# Patient Record
Sex: Female | Born: 1955
Health system: Southern US, Community
[De-identification: ages and names within clinical notes are randomized; demographics above are authoritative.]

## PROBLEM LIST (undated history)

## (undated) DIAGNOSIS — R200 Anesthesia of skin: Secondary | ICD-10-CM

## (undated) DIAGNOSIS — E119 Type 2 diabetes mellitus without complications: Secondary | ICD-10-CM

## (undated) DIAGNOSIS — R7303 Prediabetes: Secondary | ICD-10-CM

## (undated) DIAGNOSIS — I10 Essential (primary) hypertension: Secondary | ICD-10-CM

## (undated) DIAGNOSIS — M419 Scoliosis, unspecified: Secondary | ICD-10-CM

## (undated) DIAGNOSIS — G5600 Carpal tunnel syndrome, unspecified upper limb: Secondary | ICD-10-CM

## (undated) HISTORY — PX: CHOLECYSTECTOMY: SHX55

## (undated) HISTORY — PX: BACK SURGERY: SHX140

## (undated) HISTORY — PX: OTHER SURGICAL HISTORY: SHX169

## (undated) HISTORY — DX: Prediabetes: R73.03

## (undated) HISTORY — DX: Carpal tunnel syndrome, unspecified upper limb: G56.00

## (undated) HISTORY — PX: ABDOMINAL ADHESION SURGERY: SHX90

---

## 2000-01-31 ENCOUNTER — Other Ambulatory Visit: Admission: RE | Admit: 2000-01-31 | Discharge: 2000-01-31 | Payer: Self-pay | Admitting: *Deleted

## 2002-09-04 ENCOUNTER — Encounter: Payer: Self-pay | Admitting: Emergency Medicine

## 2002-09-04 ENCOUNTER — Emergency Department (HOSPITAL_COMMUNITY): Admission: EM | Admit: 2002-09-04 | Discharge: 2002-09-04 | Payer: Self-pay | Admitting: Emergency Medicine

## 2002-09-07 ENCOUNTER — Ambulatory Visit (HOSPITAL_COMMUNITY): Admission: RE | Admit: 2002-09-07 | Discharge: 2002-09-07 | Payer: Self-pay | Admitting: Orthopaedic Surgery

## 2002-09-07 ENCOUNTER — Encounter: Payer: Self-pay | Admitting: Orthopaedic Surgery

## 2002-09-14 ENCOUNTER — Ambulatory Visit (HOSPITAL_COMMUNITY): Admission: RE | Admit: 2002-09-14 | Discharge: 2002-09-14 | Payer: Self-pay | Admitting: Orthopaedic Surgery

## 2003-12-27 ENCOUNTER — Ambulatory Visit (HOSPITAL_COMMUNITY): Admission: RE | Admit: 2003-12-27 | Discharge: 2003-12-27 | Payer: Self-pay | Admitting: Family Medicine

## 2004-02-27 ENCOUNTER — Ambulatory Visit (HOSPITAL_COMMUNITY): Admission: RE | Admit: 2004-02-27 | Discharge: 2004-02-27 | Payer: Self-pay | Admitting: General Surgery

## 2004-03-05 ENCOUNTER — Ambulatory Visit (HOSPITAL_COMMUNITY): Admission: RE | Admit: 2004-03-05 | Discharge: 2004-03-05 | Payer: Self-pay | Admitting: General Surgery

## 2004-05-04 ENCOUNTER — Observation Stay (HOSPITAL_COMMUNITY): Admission: RE | Admit: 2004-05-04 | Discharge: 2004-05-05 | Payer: Self-pay | Admitting: General Surgery

## 2008-05-19 ENCOUNTER — Ambulatory Visit (HOSPITAL_COMMUNITY): Admission: RE | Admit: 2008-05-19 | Discharge: 2008-05-19 | Payer: Self-pay | Admitting: Internal Medicine

## 2008-07-27 ENCOUNTER — Ambulatory Visit (HOSPITAL_COMMUNITY): Admission: RE | Admit: 2008-07-27 | Discharge: 2008-07-27 | Payer: Self-pay | Admitting: Internal Medicine

## 2009-10-13 ENCOUNTER — Ambulatory Visit (HOSPITAL_COMMUNITY): Admission: RE | Admit: 2009-10-13 | Discharge: 2009-10-13 | Payer: Self-pay | Admitting: Internal Medicine

## 2010-06-29 NOTE — H&P (Signed)
Kara Garcia, Kara Garcia                            ACCOUNT NO.:  [id number]   MEDICAL RECORD NO.:  [id number]                  PATIENT TYPE:   LOCATION:                                       FACILITY:   PHYSICIAN:  J. Darreld Mclean, M.D.              DATE OF BIRTH:   DATE OF ADMISSION:  DATE OF DISCHARGE:                                HISTORY & PHYSICAL   CHIEF COMPLAINT:  I hurt my left knee.   The patient fell and injured her left knee on July 24.  She slipped on a  waxed floor and injured her knee.  She was seen at [facility] ER  and x-rays were taken and she was evaluated there.  No acute injury was  noted on the x-ray.  Her injury occurred in her home. I saw her in the  office on the 26th.  She had a marked swelling of the knee and I aspirated  45 cc of fluid and gave her Depo-Medrol 40.  I was concerned about a  meniscal injury to the knee and recommended an MRI.  She had MRI done at the  hospital on the 28th.  It showed nonvisualization of the ACL compatible with  a tear, tear of the posterior horn of the medial meniscus, moderate joint  effusion and edema around the medial collateral ligament with a grade 1  sprain.  The patient was informed of these findings on her office visit on  the 30th.  Surgery was recommended.  I told her ACL may be torn, and it may  eventually need surgery at a later time.  She appeared to understand.  The  risks and imponderables of the procedure were discussed with her at that  visit; and, again, she appeared to understand.   CURRENT MEDICATIONS:  The patient has been on Vicodin 5/500 and Vioxx 25 for  this problem.  The patient normally takes no other medications.   PAST HISTORY:  Negative for heart disease, lung disease, kidney disease,  stroke, __________ weakness, hypertension, diabetes, TB, rheumatic fever,  cancer, polio, ulcer disease or circulatory problems.   ALLERGIES:  She denies any allergies.   She smokes 1/2 pack of  cigarettes a day.   She does not have a family physician.   She denies any previous surgery.   She denies any disease that runs in the family.   The patient lives here in [place] and works of [employer].   PHYSICAL EXAMINATION:  VITAL SIGNS:  BP 142/78, pulse 92, respirations 20,  afebrile.  Height 5 feet 2 inches, weight 139.  GENERAL:  She is alert, cooperative, and oriented.  HEENT:  Negative.  NECK:  Supple.  LUNGS:  Clear to P&A.  HEART:  Regular without murmur heard.  ABDOMEN:  Soft, without masses.  EXTREMITIES:  The left knee has mild effusion, pain and tenderness  particularly in the medial  and medial joint line.  Range of motion is 5  degrees and 90 degrees with pain.  She has a weakly positive drawer  significant.  Other extremities within normal limits.  CENTRAL NERVOUS SYSTEM: Intact.  SKIN: Intact.   IMPRESSION:  Tear medial meniscus left knee probably anterior cruciate  ligament tear left knee.   PLAN:  Operative arthroscopy. I have told her if she has a tear of the  meniscus we will remove that but we will not do an ACL repair at this  moment.  She may need later surgery for that.  She appears to understand.  Labs are pending.                                               Teola Bradley, M.D.    JWK/MEDQ  D:  09/13/2002  T:  09/13/2002  Job:  [id number]

## 2010-06-29 NOTE — H&P (Signed)
NAMEKENETHA, Kara Garcia                ACCOUNT NO.:  [id number]   MEDICAL RECORD NO.:  [id number]          PATIENT TYPE:  AMB   LOCATION:  DAY                           FACILITY:  [facility]   PHYSICIAN:  Jerolyn Shin C. Katrinka Blazing, M.D.   DATE OF BIRTH:  Jan 01, 1956   DATE OF ADMISSION:  DATE OF DISCHARGE:  LH                                HISTORY & PHYSICAL   HISTORY OF PRESENT ILLNESS:  A 55 year old female with history of recurrent  abdominal pain, nausea and vomiting.  The patient presented in early January  with a three-week history of crampy abdominal pain with nausea and vomiting  and episodic diarrhea.  She weighed 132 pounds at that time.  Exam was  unremarkable.  She had a gallbladder ultrasound which was negative for  gallstones.  She had a HIDA scan which was borderline positive with an  ejection fraction of 36%.  The patient had nausea and pain after the fatty  meal.  It was felt that she had chronic cholecystitis.  She was treated with  Pamine with some improvement.  She continued to be symptomatic and continued  to have epigastric pain radiating to her back with nausea.  The pain has  progressed now to where it is not associated with meals.  She has had weight  loss down to 123 pounds which is nine pounds over the past two months.  Her  last episode of vomiting was about two weeks ago.  The patient is  symptomatic on a daily basis has finally consented to have removal of her  gallbladder.   PAST HISTORY:  She has no other medical illnesses.   ALLERGIES:  She has no allergies.   PAST SURGICAL HISTORY:  1.  Pelvic laparoscopy x2.  2.  Left knee arthroscopy.   The patient does have some symptoms of depression which has responded to  Zoloft.  She has perimenopausal symptoms which has responded to low-dose  estrogen.   MEDICATIONS:  1.  Aciphex 20 mg daily.  2.  Pamine 5 mg t.i.d.  3.  Zoloft 50 mg daily.   SOCIAL HISTORY:  She is married.  She is an Adult nurse. She  smokes a pack of cigarettes a day, drinks an occasion alcoholic beverage.   PHYSICAL EXAMINATION:  GENERAL:  She is a very thin female in mild distress  due to abdominal pain and nausea.  VITAL SIGNS:  Blood pressure 150/88, pulse 72, respirations 20, weight 123  pounds, temperature 98.1.  HEENT:  Unremarkable.  NECK:  Supple.  No JVD, bruits, adenopathy, or thyromegaly.  CHEST:  Clear to auscultation.  HEART:  Regular rate and rhythm without murmur, gallop or rub.  ABDOMEN:  Moderate epigastric and right upper quadrant tenderness.  Normal  bowel sounds.  Prominent lower ribs bilaterally without tenderness.  EXTREMITIES:  No cyanosis, clubbing or edema.  NEUROLOGIC:  No focal motor, sensory or cerebellar deficits.   IMPRESSION:  1.  Chronic acalculous cholecystitis.  2.  Mild hypertension.  3.  Irritable bowel syndrome.  4.  Perimenopausal syndrome.   PLAN:  The  patient will have diagnostic laparoscopy with laparoscopic  cholecystectomy.      LCS/MEDQ  D:  05/04/2004  T:  05/04/2004  Job:  [id number]

## 2010-06-29 NOTE — Op Note (Signed)
NAMEBULA, CAVALIERI                          ACCOUNT NO.:  [id number]   MEDICAL RECORD NO.:  [id number]                   PATIENT TYPE:  AMB   LOCATION:  DAY                                  FACILITY:  [facility]   PHYSICIAN:  J. Darreld Mclean, M.D.              DATE OF BIRTH:  [birth year]   DATE OF PROCEDURE:  09/14/2002  DATE OF DISCHARGE:                                 OPERATIVE REPORT   PREOPERATIVE DIAGNOSIS:  Tear of medial meniscus, left knee, with anterior  cruciate ligament tear.   POSTOPERATIVE DIAGNOSIS:  Tear of medial meniscus, left knee, with anterior  cruciate ligament tear.   PROCEDURE:  Partial medial meniscectomy with arthroscopy of the left knee,  debridement of anterior cruciate ligament.   ANESTHESIA:  General.   TOURNIQUET TIME:  33 minutes.   SURGEON:  J. Darreld Mclean, M.D.   DRAINS:  None.   INDICATIONS:  The patient was playing tennis.  The left knee MRI shows tear  of the medial meniscus.  She has an old ACL tear.  She did not improve with  conservative treatment, and surgery was recommended.  Risks and  imponderables were discussed with the patient preoperatively.  She appears  to understand and agreed to the procedure as outlined.   FINDINGS:  The suprapatellar pouch was normal.  The medial meniscus had a  bucket-handle tear.  There was an absence of the ACL, it was just a slight  stump.  The lateral meniscus was normal.  The articular surfaces had some  early grade 2 changes medially and looked good laterally.  There were no  loose bodies.   Pertinent pictures taken throughout the procedure.   DESCRIPTION OF PROCEDURE:  The patient placed supine on the operating table  and the tourniquet and leg holder placed deflated on the left upper thigh.  She was prepped and draped in the usual manner after general anesthesia had  been given.  We re-ascertained we were doing Ms. Semidey.  Inflow cannula  inserted medially after the tourniquet was inflated.   I circumferentially  wrapped the leg with an Esmarch bandage prior to elevation of the tourniquet  to 300 mmHg.  Arthroscope inserted laterally and the knee systematically  examined.  Please see findings above.  She had a bucket-handle tear.  Using  a hook knife and straight knife, we were able to get some fragments.  We  were having difficulty with the shaver.  The first shaver did not work.  The  second shaver was brought in and we had some problems with the foot control.  I was able to get it working through the hand control but it took  approximately 5-10 minutes, which added some delay to the case.  No harm was  done to the patient at all.  The meniscal ends were then debrided using a  meniscal shaver.  A good smooth contour was  obtained.  The knee was  systematically re-examined and no new pathology found.  The stump of the ACL  was slightly debrided as well.  The knee was irrigated with the remaining  part of the lactated Ringer's.  The wounds were reapproximated using 3-0  nylon in an interrupted vertical mattress manner.  Marcaine 0.25% was  instilled in each portal after the sutures had been tied.  The tourniquet  was deflated at 33 minutes.  A sterile dressing applied, bulky dressing  applied, knee immobilizer applied.  The patient had a prescription for  Vicodin ES for pain.  We will see her in the office in approximately 10 days  to two weeks.  Physical therapy has been scheduled.  For any difficulty she  is to contact me at the office or through the hospital beeper system.  Numbers have been provided.                                               Teola Bradley, M.D.    JWK/MEDQ  D:  09/14/2002  T:  09/14/2002  Job:  [id number]

## 2010-06-29 NOTE — Op Note (Signed)
NAMEAISHWARYA, Kara Garcia                ACCOUNT NO.:  [id number]   MEDICAL RECORD NO.:  [id number]          PATIENT TYPE:  AMB   LOCATION:  DAY                           FACILITY:  [facility]   PHYSICIAN:  Jerolyn Shin C. Katrinka Blazing, M.D.   DATE OF BIRTH:  May 25, 1955   DATE OF PROCEDURE:  05/04/2004  DATE OF DISCHARGE:                                 OPERATIVE REPORT   PREOPERATIVE DIAGNOSIS:  Chronic acalculous cholecystitis   POSTOPERATIVE DIAGNOSIS:  Chronic acalculous cholecystitis   PROCEDURE:  Laparoscopic cholecystectomy.   SURGEON:  Dr. Katrinka Blazing.   DESCRIPTION:  Under general anesthesia, the patient's abdomen was prepped  and draped in a sterile field. A curvilinear infraumbilical incision was  made. Veress needle was inserted without difficulty. Abdomen was insufflated  with 2 liters of CO2. Using a Visiport guide, a 10-mm port was placed  uneventfully. Laparoscope was placed. Gallbladder was visualized. The  patient was placed in reverse Trendelenburg position. Under videoscopic  guidance, a 10-mm port and two 5-mm ports were placed without difficulty.  The gallbladder was grasped by the assistant. Adhesions to the gallbladder  were taken down with blunt dissection and electrocautery. There was a small  cystic duct which was followed back to the gallbladder. It was clipped with  five clips and divided. There were two small cystic artery branches. Each  was clipped with three clips and divided. Using electrocautery, the  gallbladder was then separated from the intrahepatic bed without difficulty.  It was placed in an EndoCatch device and retrieved intact. There was minimal  bleeding from the bed. Irrigation was carried out and the fluid was clear.  There was no bleeding and no bile leak. Irrigation above the liver was  carried out. There was a few adhesions above the liver between the liver and  the peritoneum which were not removed. The patient was then placed in  reverse Trendelenburg position.  Camera was moved from the infraumbilical  position into the right upper quadrant paramedian position. Inspection of  the lower abdomen revealed that there were some adhesions in the lower  midline. These were inferior to the site of the insertion of the  infraumbilical port. The adhesions were between omentum and abdominal wall  and appeared to be quite dense. The pelvis was inspected. The uterus did not  look remarkable. The ovaries appeared to be unremarkable. The sigmoid colon  looked normal. There was no thickening of the colon. There were no adhesions  of the colon to the surrounding structures. The visualized small bowel  appeared to be normal. It was elected not to take down the lower midline  adhesions since she was not having a problem in this area and since there  was no bowel involved. The patient was moved back to the neutral position.  CO2 was allowed to escape from the abdomen and the ports were removed. The  infraumbilical port was closed using 0 Vicryl on the  fascia and 4-0 Vicryl on the skin in a subcuticular pattern. The other  incisions were closed with staples. Dressings of OpSite were placed. The  patient  tolerated procedure well. She was awakened from anesthesia  uneventfully, transferred to a bed and taken to the postanesthetic care unit  for monitoring.      LCS/MEDQ  D:  05/04/2004  T:  05/04/2004  Job:  [id number]

## 2010-11-01 ENCOUNTER — Other Ambulatory Visit (HOSPITAL_COMMUNITY): Payer: Self-pay | Admitting: Internal Medicine

## 2010-11-01 DIAGNOSIS — Z139 Encounter for screening, unspecified: Secondary | ICD-10-CM

## 2010-11-23 ENCOUNTER — Ambulatory Visit (HOSPITAL_COMMUNITY): Payer: BC Managed Care – PPO

## 2010-12-07 ENCOUNTER — Ambulatory Visit (HOSPITAL_COMMUNITY)
Admission: RE | Admit: 2010-12-07 | Discharge: 2010-12-07 | Disposition: A | Discharge status: Home / Self Care | Payer: BC Managed Care – PPO | Source: Ambulatory Visit | Attending: Internal Medicine | Admitting: Internal Medicine

## 2010-12-07 DIAGNOSIS — Z1231 Encounter for screening mammogram for malignant neoplasm of breast: Secondary | ICD-10-CM | POA: Insufficient documentation

## 2010-12-07 DIAGNOSIS — Z139 Encounter for screening, unspecified: Secondary | ICD-10-CM

## 2011-02-18 ENCOUNTER — Emergency Department (HOSPITAL_COMMUNITY)
Admission: EM | Admit: 2011-02-18 | Discharge: 2011-02-18 | Disposition: A | Discharge status: Home / Self Care | Payer: BC Managed Care – PPO | Attending: Emergency Medicine | Admitting: Emergency Medicine

## 2011-02-18 ENCOUNTER — Emergency Department (HOSPITAL_COMMUNITY): Payer: BC Managed Care – PPO

## 2011-02-18 ENCOUNTER — Encounter: Payer: Self-pay | Admitting: Emergency Medicine

## 2011-02-18 DIAGNOSIS — M5412 Radiculopathy, cervical region: Secondary | ICD-10-CM | POA: Insufficient documentation

## 2011-02-18 DIAGNOSIS — I10 Essential (primary) hypertension: Secondary | ICD-10-CM | POA: Insufficient documentation

## 2011-02-18 DIAGNOSIS — F17200 Nicotine dependence, unspecified, uncomplicated: Secondary | ICD-10-CM | POA: Insufficient documentation

## 2011-02-18 DIAGNOSIS — M79609 Pain in unspecified limb: Secondary | ICD-10-CM | POA: Insufficient documentation

## 2011-02-18 DIAGNOSIS — R209 Unspecified disturbances of skin sensation: Secondary | ICD-10-CM | POA: Insufficient documentation

## 2011-02-18 HISTORY — DX: Essential (primary) hypertension: I10

## 2011-02-18 NOTE — ED Notes (Signed)
Discharge instructions reviewed with pt, questions answered. Pt verbalized understanding.  

## 2011-02-18 NOTE — ED Provider Notes (Signed)
History     CSN: [id number]  Arrival date & time 02/18/11  8469   First MD Initiated Contact with Patient 02/18/11 1011      Chief Complaint  Patient presents with  . Arm Pain  . Numbness    (Consider location/radiation/quality/duration/timing/severity/associated sxs/prior treatment) Patient is a 56 y.o. female presenting with arm pain. The history is provided by the patient and a relative.  Arm Pain This is a new problem. The current episode started in the past 7 days. The problem occurs constantly. The problem has been gradually worsening (She describes a 4 day history of left sided neck ,  shoulder and posterior upper back pain and placed on hydrcodone and robaxin by her doctor 3 days ago.  The medicines make her drowsy,  but have not improved her discomfort. Numbness today under left arm.). Associated symptoms include neck pain and numbness. Pertinent negatives include no abdominal pain, arthralgias, chest pain, congestion, fever, headaches, joint swelling, myalgias, nausea, rash, sore throat or weakness. Exacerbated by: Movement makes worse. She has tried oral narcotics (Robaxin) for the symptoms. The treatment provided no relief.    Past Medical History  Diagnosis Date  . Hypertension     Past Surgical History  Procedure Date  . Cholecystectomy   . Knee arthroplasty   . Abdominal adhesion surgery     History reviewed. No pertinent family history.  History  Substance Use Topics  . Smoking status: Current Everyday Smoker -- 1.0 packs/day  . Smokeless tobacco: Not on file  . Alcohol Use: No    OB History    Grav Para Term Preterm Abortions TAB SAB Ect Mult Living                  Review of Systems  Constitutional: Negative for fever.  HENT: Positive for neck pain. Negative for congestion, sore throat and neck stiffness.   Eyes: Negative.   Respiratory: Negative for chest tightness and shortness of breath.   Cardiovascular: Negative for chest pain.    Gastrointestinal: Negative for nausea and abdominal pain.  Genitourinary: Negative.   Musculoskeletal: Negative for myalgias, joint swelling and arthralgias.  Skin: Negative.  Negative for rash and wound.  Neurological: Positive for numbness. Negative for dizziness, weakness, light-headedness and headaches.  Hematological: Negative.   Psychiatric/Behavioral: Negative.     Allergies  Review of patient's allergies indicates no known allergies.  Home Medications   Current Outpatient Rx  Name Route Sig Dispense Refill  . HYDROCODONE-ACETAMINOPHEN 5-500 MG PO TABS Oral Take 1 tablet by mouth every 6 (six) hours as needed. For pain     . IBUPROFEN 200 MG PO TABS Oral Take 400 mg by mouth every 6 (six) hours as needed. For pain     . LOSARTAN POTASSIUM 100 MG PO TABS Oral Take 100 mg by mouth daily.      Marland Kitchen METHOCARBAMOL 750 MG PO TABS Oral Take 750 mg by mouth every 8 (eight) hours as needed. For pain     . ANALGESIC BALM 15-15 % EX OINT Topical Apply 1 application topically as needed. For muscle pain      . VITAMIN D (ERGOCALCIFEROL) 50000 UNITS PO CAPS Oral Take 50,000 Units by mouth every 7 (seven) days. On wednesdays       BP 151/76  Temp(Src) 98 F (36.7 C) (Oral)  Resp 18  Ht 5\' 1"  (1.549 m)  Wt 146 lb 5 oz (66.367 kg)  BMI 27.65 kg/m2  SpO2 99%  Physical Exam  Nursing note and vitals reviewed. Constitutional: She is oriented to person, place, and time. She appears well-developed and well-nourished.  HENT:  Head: Normocephalic and atraumatic.  Eyes: Conjunctivae are normal.  Neck: Normal range of motion.  Cardiovascular: Normal rate, regular rhythm, normal heart sounds and intact distal pulses.   Pulmonary/Chest: Effort normal and breath sounds normal. She has no wheezes.  Abdominal: Soft. Bowel sounds are normal. There is no tenderness.  Musculoskeletal: She exhibits tenderness. She exhibits no edema.       Right shoulder: She exhibits normal strength.       Cervical  back: She exhibits tenderness and pain. She exhibits no swelling, no edema and no spasm.       Arms: Neurological: She is alert and oriented to person, place, and time. She has normal strength. A sensory deficit is present.  Reflex Scores:      Tricep reflexes are 2+ on the right side and 2+ on the left side.      Bicep reflexes are 2+ on the right side and 2+ on the left side.      Equal grip strength.  Decreased sensation to fine touch left upper and lower forearm in ulnar distribution.  No rash,  No decreased strength in hand,  Fingers or wrist.  Skin: Skin is warm and dry.  Psychiatric: She has a normal mood and affect.    ED Course  Procedures (including critical care time)  Labs Reviewed - No data to display Dg Cervical Spine Complete  02/18/2011  *RADIOLOGY REPORT*  Clinical Data: Numbness down the left arm for 3 days.  CERVICAL SPINE - COMPLETE 4+ VIEW  Comparison: None.  Findings: Vertebral body height is maintained.  There is reversal of the normal cervical lordosis from C3-C6.  Bulky anterior endplate spurring is noted at C5-6.  Posterior endplate spurring is most prominent at C4-5 and C5-6.  There is multilevel advanced facet degenerative disease.  No fracture is identified.  Foraminal narrowing is present at multiple levels, most notable on the left and C3-4 and C6-7 and on the right at C3-4 and C5-6.  Lung apices are clear.  IMPRESSION: Marked multilevel degenerative disease.  No acute finding.  Original Report Authenticated By: Bernadene Bell. D'ALESSIO, M.D.     1. Cervical radiculopathy       MDM  Patient currently on vicodin and robaxin, ibuprofen.  No meds added to this regimen.  Encouraged heat applied 20 minutes several times daily.  F/u with pcp for possible mri if not improving over the next week.  Pt xray results discussed with her.          Candis Musa, PA 02/18/11 2134

## 2011-02-18 NOTE — ED Notes (Signed)
Pt seen by Dr.Hawking Friday for pain. Pt states pain was present but the numbness she has today was not there. Also complaining of pain under left arm. Denies CHEST PAIN OR SOB>

## 2011-02-20 NOTE — ED Provider Notes (Signed)
Medical screening examination/treatment/procedure(s) were performed by non-physician practitioner and as supervising physician I was immediately available for consultation/collaboration.   Teletha Petrea W Flornce Record, MD 02/20/11 1521 

## 2011-02-25 ENCOUNTER — Other Ambulatory Visit (HOSPITAL_COMMUNITY): Payer: Self-pay | Admitting: Internal Medicine

## 2011-02-25 DIAGNOSIS — M549 Dorsalgia, unspecified: Secondary | ICD-10-CM

## 2011-02-27 ENCOUNTER — Ambulatory Visit (HOSPITAL_COMMUNITY)
Admission: RE | Admit: 2011-02-27 | Discharge: 2011-02-27 | Disposition: A | Discharge status: Home / Self Care | Payer: BC Managed Care – PPO | Source: Ambulatory Visit | Attending: Internal Medicine | Admitting: Internal Medicine

## 2011-02-27 DIAGNOSIS — M549 Dorsalgia, unspecified: Secondary | ICD-10-CM

## 2011-02-27 DIAGNOSIS — M542 Cervicalgia: Secondary | ICD-10-CM | POA: Insufficient documentation

## 2011-02-27 DIAGNOSIS — M47812 Spondylosis without myelopathy or radiculopathy, cervical region: Secondary | ICD-10-CM | POA: Insufficient documentation

## 2011-02-27 DIAGNOSIS — R209 Unspecified disturbances of skin sensation: Secondary | ICD-10-CM | POA: Insufficient documentation

## 2011-02-27 DIAGNOSIS — M5020 Other cervical disc displacement, unspecified cervical region: Secondary | ICD-10-CM | POA: Insufficient documentation

## 2011-02-27 DIAGNOSIS — M25519 Pain in unspecified shoulder: Secondary | ICD-10-CM | POA: Insufficient documentation

## 2011-02-27 DIAGNOSIS — M5380 Other specified dorsopathies, site unspecified: Secondary | ICD-10-CM | POA: Insufficient documentation

## 2012-11-05 NOTE — H&P (Signed)
  NTS SOAP Note  Vital Signs:  Vitals as of: 11/05/2012: Systolic 156: Diastolic 77: Heart Rate 67: Temp 97.28F: Height 88ft 1in: Weight 127Lbs 0 Ounces: BMI 24  BMI : 24 kg/m2  Subjective: This 58 Years 28 Months old Female presents for screening colonoscopy.  Denies any gi complaints.  No family h/o colon cancer.  Never has had a colonoscopy.   Review of Symptoms:  Constitutional:unremarkable   Head:unremarkable    Eyes:unremarkable   Nose/Mouth/Throat:unremarkable Cardiovascular:  unremarkable   Respiratory:unremarkable   Gastrointestinal:  unremarkable   Genitourinary:unremarkable     Musculoskeletal:unremarkable   Skin:unremarkable Hematolgic/Lymphatic:unremarkable     Allergic/Immunologic:unremarkable     Past Medical History:    Reviewed   Past Medical History  Surgical History: none Medical Problems: HTN Allergies: nkda Medications: losartin   Social History:Reviewed  Social History  Preferred Language: English Race:  White Ethnicity: Not Hispanic / Latino Age: 75 Years 4 Months Marital Status:  M Alcohol:  No Recreational drug(s):  No   Smoking Status: Light tobacoo smoker reviewed on 11/05/2012 Started Date: 02/11/1978 Packs per day: 0.50 Functional Status reviewed on mm/dd/yyyy ------------------------------------------------ Bathing: Normal Cooking: Normal Dressing: Normal Driving: Normal Eating: Normal Managing Meds: Normal Oral Care: Normal Shopping: Normal Toileting: Normal Transferring: Normal Walking: Normal Cognitive Status reviewed on mm/dd/yyyy ------------------------------------------------ Attention: Normal Decision Making: Normal Language: Normal Memory: Normal Motor: Normal Perception: Normal Problem Solving: Normal Visual and Spatial: Normal   Family History:  Reviewed  Family Health History Mother, Deceased; Heart attack (myocardial infarction);  Father, Deceased;  Cancer unspecified;     Objective Information: General:  Well appearing, well nourished in no distress. Heart:  RRR, no murmur Lungs:    CTA bilaterally, no wheezes, rhonchi, rales.  Breathing unlabored. Abdomen:Soft, NT/ND, no HSM, no masses.   deferred to procedure  Assessment:Need for screening TCS  Diagnosis &amp; Procedure Smart Code   Plan:Scheduled for screening TCS on 11/17/12.   Patient Education:Alternative treatments to surgery were discussed with patient (and family).  Risks and benefits  of procedure including bleeding and perforation were fully explained to the patient (and family) who gave informed consent. Patient/family questions were addressed.  Follow-up:Pending Surgery

## 2012-11-17 ENCOUNTER — Encounter (HOSPITAL_COMMUNITY): Payer: Self-pay | Admitting: *Deleted

## 2012-11-17 ENCOUNTER — Encounter (HOSPITAL_COMMUNITY)
Admission: RE | Disposition: A | Discharge status: Home / Self Care | Payer: Self-pay | Source: Ambulatory Visit | Attending: General Surgery

## 2012-11-17 ENCOUNTER — Ambulatory Visit (HOSPITAL_COMMUNITY)
Admission: RE | Admit: 2012-11-17 | Discharge: 2012-11-17 | Disposition: A | Discharge status: Home / Self Care | Payer: BC Managed Care – PPO | Source: Ambulatory Visit | Attending: General Surgery | Admitting: General Surgery

## 2012-11-17 DIAGNOSIS — I10 Essential (primary) hypertension: Secondary | ICD-10-CM | POA: Insufficient documentation

## 2012-11-17 DIAGNOSIS — Z1211 Encounter for screening for malignant neoplasm of colon: Secondary | ICD-10-CM | POA: Insufficient documentation

## 2012-11-17 HISTORY — DX: Anesthesia of skin: R20.0

## 2012-11-17 HISTORY — PX: COLONOSCOPY: SHX5424

## 2012-11-17 SURGERY — COLONOSCOPY
Anesthesia: Moderate Sedation

## 2012-11-17 MED ORDER — SODIUM CHLORIDE 0.9 % IV SOLN
INTRAVENOUS | Status: DC
  Administered 2012-11-17: 08:00:00 via INTRAVENOUS

## 2012-11-17 MED ORDER — MIDAZOLAM HCL 5 MG/5ML IJ SOLN
INTRAMUSCULAR | Status: AC
  Filled 2012-11-17: qty 5

## 2012-11-17 MED ORDER — STERILE WATER FOR IRRIGATION IR SOLN
Status: DC | PRN
  Administered 2012-11-17: 09:00:00

## 2012-11-17 MED ORDER — MEPERIDINE HCL 50 MG/ML IJ SOLN
INTRAMUSCULAR | Status: DC | PRN
  Administered 2012-11-17: 50 mg via INTRAVENOUS

## 2012-11-17 MED ORDER — MEPERIDINE HCL 50 MG/ML IJ SOLN
INTRAMUSCULAR | Status: AC
  Filled 2012-11-17: qty 1

## 2012-11-17 MED ORDER — MIDAZOLAM HCL 5 MG/5ML IJ SOLN
INTRAMUSCULAR | Status: DC | PRN
  Administered 2012-11-17: 3 mg via INTRAVENOUS
  Administered 2012-11-17: 1 mg via INTRAVENOUS

## 2012-11-17 NOTE — Op Note (Addendum)
[facility] [address] [place], [address]   COLONOSCOPY PROCEDURE REPORT  PATIENT: Kara Garcia, Kara Garcia  MR#: [id number] BIRTHDATE: Feb 17, 1955 , 57  yrs. old GENDER: Female ENDOSCOPIST: Franky Macho, MD REFERRED XB:JYNW, Zach PROCEDURE DATE:  11/17/2012 PROCEDURE:   Colonoscopy, screening ASA CLASS:   Class II INDICATIONS:Average risk patient for colon cancer. MEDICATIONS: Versed 4 mg IV and Demerol 50 mg IV  DESCRIPTION OF PROCEDURE:   After the risks benefits and alternatives of the procedure were thoroughly explained, informed consent was obtained.  A digital rectal exam revealed no abnormalities of the rectum.   The EC-[id number] ([id number])  endoscope was introduced through the anus and advanced to the cecum, which was identified by both the appendix and ileocecal valve. No adverse events experienced.   The quality of the prep was adequate, using MoviPrep  The instrument was then slowly withdrawn as the colon was fully examined.      COLON FINDINGS: A normal appearing cecum, ileocecal valve, and appendiceal orifice were identified.  The ascending, hepatic flexure, transverse, splenic flexure, descending, sigmoid colon and rectum appeared unremarkable.  No polyps or cancers were seen. Retroflexed views revealed no abnormalities. The time to cecum=7 minutes 0 seconds.  Withdrawal time=2 minutes 0 seconds.  The scope was withdrawn and the procedure completed. COMPLICATIONS: There were no complications.  ENDOSCOPIC IMPRESSION: Normal colon  RECOMMENDATIONS: Repeat Colonscopy in 10 years.   eSigned:  Franky Macho, MD 11/17/2012 9:35 AM   cc:

## 2012-11-17 NOTE — Interval H&P Note (Signed)
History and Physical Interval Note:  11/17/2012 9:19 AM  Kara Garcia  has presented today for surgery, with the diagnosis of screening  The various methods of treatment have been discussed with the patient and family. After consideration of risks, benefits and other options for treatment, the patient has consented to  Procedure(s): COLONOSCOPY (N/A) as a surgical intervention .  The patient's history has been reviewed, patient examined, no change in status, stable for surgery.  I have reviewed the patient's chart and labs.  Questions were answered to the patient's satisfaction.     [name]

## 2012-11-19 ENCOUNTER — Encounter (HOSPITAL_COMMUNITY): Payer: Self-pay | Admitting: General Surgery

## 2014-12-16 ENCOUNTER — Other Ambulatory Visit (HOSPITAL_COMMUNITY): Payer: Self-pay | Admitting: Internal Medicine

## 2014-12-16 ENCOUNTER — Ambulatory Visit (HOSPITAL_COMMUNITY)
Admission: RE | Admit: 2014-12-16 | Discharge: 2014-12-16 | Disposition: A | Discharge status: Home / Self Care | Payer: BLUE CROSS/BLUE SHIELD | Source: Ambulatory Visit | Attending: Internal Medicine | Admitting: Internal Medicine

## 2014-12-16 DIAGNOSIS — M25512 Pain in left shoulder: Secondary | ICD-10-CM | POA: Diagnosis present

## 2014-12-16 DIAGNOSIS — M4040 Postural lordosis, site unspecified: Secondary | ICD-10-CM | POA: Insufficient documentation

## 2014-12-16 DIAGNOSIS — M542 Cervicalgia: Secondary | ICD-10-CM | POA: Insufficient documentation

## 2014-12-16 DIAGNOSIS — M47892 Other spondylosis, cervical region: Secondary | ICD-10-CM | POA: Insufficient documentation

## 2015-01-11 ENCOUNTER — Other Ambulatory Visit: Payer: Self-pay | Admitting: Orthopedic Surgery

## 2015-01-11 DIAGNOSIS — M4722 Other spondylosis with radiculopathy, cervical region: Secondary | ICD-10-CM

## 2015-01-24 ENCOUNTER — Ambulatory Visit
Admission: RE | Admit: 2015-01-24 | Discharge: 2015-01-24 | Disposition: A | Discharge status: Home / Self Care | Payer: BLUE CROSS/BLUE SHIELD | Source: Ambulatory Visit | Attending: Orthopedic Surgery | Admitting: Orthopedic Surgery

## 2015-01-24 DIAGNOSIS — M4722 Other spondylosis with radiculopathy, cervical region: Secondary | ICD-10-CM

## 2015-01-24 MED ORDER — ONDANSETRON HCL 4 MG/2ML IJ SOLN
4.0000 mg | Freq: Once | INTRAMUSCULAR | Status: AC
  Administered 2015-01-24: 4 mg via INTRAMUSCULAR

## 2015-01-24 MED ORDER — DIAZEPAM 5 MG PO TABS
10.0000 mg | ORAL_TABLET | Freq: Once | ORAL | Status: AC
  Administered 2015-01-24: 10 mg via ORAL

## 2015-01-24 MED ORDER — SODIUM CHLORIDE 0.9 % IV SOLN
Freq: Once | INTRAVENOUS | Status: AC
  Administered 2015-01-24: 11:00:00 via INTRAVENOUS

## 2015-01-24 MED ORDER — IOHEXOL 300 MG/ML  SOLN
10.0000 mL | Freq: Once | INTRAMUSCULAR | Status: AC | PRN
  Administered 2015-01-24: 10 mL via INTRATHECAL

## 2015-01-24 MED ORDER — CEFAZOLIN SODIUM-DEXTROSE 2-3 GM-% IV SOLR
2.0000 g | Freq: Once | INTRAVENOUS | Status: AC
  Administered 2015-01-24: 2 g via INTRAVENOUS

## 2015-01-24 MED ORDER — MEPERIDINE HCL 100 MG/ML IJ SOLN
75.0000 mg | Freq: Once | INTRAMUSCULAR | Status: AC
  Administered 2015-01-24: 75 mg via INTRAMUSCULAR

## 2015-01-24 NOTE — Progress Notes (Signed)
Patient states she has been off Tramadol for at least the past two days. 

## 2015-01-24 NOTE — Discharge Instructions (Signed)
Myelogram Discharge Instructions  1. Go home and rest quietly for the next 24 hours.  It is important to lie flat for the next 24 hours.  Get up only to go to the restroom.  You may lie in the bed or on a couch on your back, your stomach, your left side or your right side.  You may have one pillow under your head.  You may have pillows between your knees while you are on your side or under your knees while you are on your back.  2. DO NOT drive today.  Recline the seat as far back as it will go, while still wearing your seat belt, on the way home.  3. You may get up to go to the bathroom as needed.  You may sit up for 10 minutes to eat.  You may resume your normal diet and medications unless otherwise indicated.  Drink lots of extra fluids today and tomorrow.  4. The incidence of headache, nausea, or vomiting is about 5% (one in 20 patients).  If you develop a headache, lie flat and drink plenty of fluids until the headache goes away.  Caffeinated beverages may be helpful.  If you develop severe nausea and vomiting or a headache that does not go away with flat bed rest, call [phone].  5. You may resume normal activities after your 24 hours of bed rest is over; however, do not exert yourself strongly or do any heavy lifting tomorrow. If when you get up you have a headache when standing, go back to bed and force fluids for another 24 hours.  6. Call your physician for a follow-up appointment.  The results of your myelogram will be sent directly to your physician by the following day.  7. If you have any questions or if complications develop after you arrive home, please call [phone].  Discharge instructions have been explained to the patient.  The patient, or the person responsible for the patient, fully understands these instructions.      May resume Tramadol on Dec. 14, 2016, after 9:30 am.

## 2015-03-21 ENCOUNTER — Encounter: Payer: BLUE CROSS/BLUE SHIELD | Admitting: Neurology

## 2015-03-22 ENCOUNTER — Encounter: Payer: Self-pay | Admitting: Neurology

## 2015-03-22 ENCOUNTER — Ambulatory Visit (INDEPENDENT_AMBULATORY_CARE_PROVIDER_SITE_OTHER): Payer: BLUE CROSS/BLUE SHIELD | Admitting: Neurology

## 2015-03-22 ENCOUNTER — Ambulatory Visit (INDEPENDENT_AMBULATORY_CARE_PROVIDER_SITE_OTHER): Payer: Self-pay | Admitting: Neurology

## 2015-03-22 DIAGNOSIS — G5601 Carpal tunnel syndrome, right upper limb: Secondary | ICD-10-CM

## 2015-03-22 DIAGNOSIS — G5603 Carpal tunnel syndrome, bilateral upper limbs: Secondary | ICD-10-CM

## 2015-03-22 DIAGNOSIS — G5602 Carpal tunnel syndrome, left upper limb: Secondary | ICD-10-CM

## 2015-03-22 DIAGNOSIS — G5600 Carpal tunnel syndrome, unspecified upper limb: Secondary | ICD-10-CM

## 2015-03-22 HISTORY — DX: Carpal tunnel syndrome, unspecified upper limb: G56.00

## 2015-03-22 NOTE — Procedures (Signed)
     HISTORY:  Kara Garcia is a 60 year old patient with a history of prior cervical spine surgery. She has had difficulty with numbness of both hands since 2013, with some tingling sensations down the left arm that have been present since the cervical spine surgery. The patient is being evaluated for a possible neuropathy or a cervical radiculopathy.  NERVE CONDUCTION STUDIES:  Nerve conduction studies were performed on both upper extremities. The distal motor latencies for the median nerves were prolonged bilaterally, with normal motor amplitudes for these nerves bilaterally. The distal motor latencies and motor amplitudes for the ulnar nerves were normal bilaterally. The F wave latencies for the median nerves were borderline normal on the right, normal on the left, and normal for the ulnar nerves bilaterally. The nerve conduction velocities for the median and ulnar nerves were normal bilaterally. The sensory latencies for the median nerves were prolonged bilaterally, normal for the ulnar nerves bilaterally.  EMG STUDIES:  EMG study was performed on the left upper extremity:  The first dorsal interosseous muscle reveals 2 to 4 K units with full recruitment. No fibrillations or positive waves were noted. The abductor pollicis brevis muscle reveals 2 to 4 K units with full recruitment. No fibrillations or positive waves were noted. The extensor indicis proprius muscle reveals 1 to 3 K units with full recruitment. No fibrillations or positive waves were noted. The pronator teres muscle reveals 2 to 3 K units with full recruitment. No fibrillations or positive waves were noted. The biceps muscle reveals 1 to 2 K units with full recruitment. No fibrillations or positive waves were noted. The triceps muscle reveals 2 to 4 K units with full recruitment. No fibrillations or positive waves were noted. The anterior deltoid muscle reveals 2 to 3 K units with full recruitment. No fibrillations or  positive waves were noted. The cervical paraspinal muscles were tested at 2 levels. No abnormalities of insertional activity were seen at either level tested. There was good relaxation.   IMPRESSION:  Nerve conduction studies done on both upper extremities shows evidence of bilateral tunnel syndrome of mild severity, somewhat more prominent on the right than the left. EMG evaluation of the left upper extremity was unremarkable, without evidence of an overlying cervical radiculopathy.  Marlan Palau MD 03/22/2015 1:28 PM  [facility] [address] [place], [place] [address]  Phone [phone] Fax [phone]

## 2015-03-22 NOTE — Progress Notes (Signed)
Please refer to EMG and nerve conduction study procedure note. 

## 2015-03-31 ENCOUNTER — Other Ambulatory Visit (HOSPITAL_COMMUNITY): Payer: Self-pay | Admitting: Rehabilitation

## 2015-03-31 DIAGNOSIS — M5442 Lumbago with sciatica, left side: Secondary | ICD-10-CM

## 2015-04-12 ENCOUNTER — Ambulatory Visit (HOSPITAL_COMMUNITY)
Admission: RE | Admit: 2015-04-12 | Discharge: 2015-04-12 | Disposition: A | Discharge status: Home / Self Care | Payer: BLUE CROSS/BLUE SHIELD | Source: Ambulatory Visit | Attending: Rehabilitation | Admitting: Rehabilitation

## 2015-04-12 DIAGNOSIS — M5136 Other intervertebral disc degeneration, lumbar region: Secondary | ICD-10-CM | POA: Diagnosis not present

## 2015-04-12 DIAGNOSIS — M4806 Spinal stenosis, lumbar region: Secondary | ICD-10-CM | POA: Insufficient documentation

## 2015-04-12 DIAGNOSIS — M5442 Lumbago with sciatica, left side: Secondary | ICD-10-CM | POA: Insufficient documentation

## 2015-04-14 ENCOUNTER — Encounter: Payer: BLUE CROSS/BLUE SHIELD | Admitting: Neurology

## 2015-04-17 ENCOUNTER — Encounter: Payer: Self-pay | Admitting: Neurology

## 2016-02-23 ENCOUNTER — Other Ambulatory Visit (HOSPITAL_COMMUNITY): Payer: Self-pay | Admitting: Internal Medicine

## 2016-02-23 DIAGNOSIS — Z1231 Encounter for screening mammogram for malignant neoplasm of breast: Secondary | ICD-10-CM

## 2016-03-04 ENCOUNTER — Ambulatory Visit (HOSPITAL_COMMUNITY)
Admission: RE | Admit: 2016-03-04 | Discharge: 2016-03-04 | Disposition: A | Discharge status: Home / Self Care | Payer: BLUE CROSS/BLUE SHIELD | Source: Ambulatory Visit | Attending: Internal Medicine | Admitting: Internal Medicine

## 2016-03-04 DIAGNOSIS — Z1231 Encounter for screening mammogram for malignant neoplasm of breast: Secondary | ICD-10-CM | POA: Diagnosis present

## 2016-09-20 IMAGING — DX DG SHOULDER 2+V*L*
3 series · 3 of 3 positions shown · non-contrast
Comparison: None.

CLINICAL DATA: Neck and left shoulder pain.  No known injury.

EXAM:
LEFT SHOULDER - 2+ VIEW

[shoulder grashey]
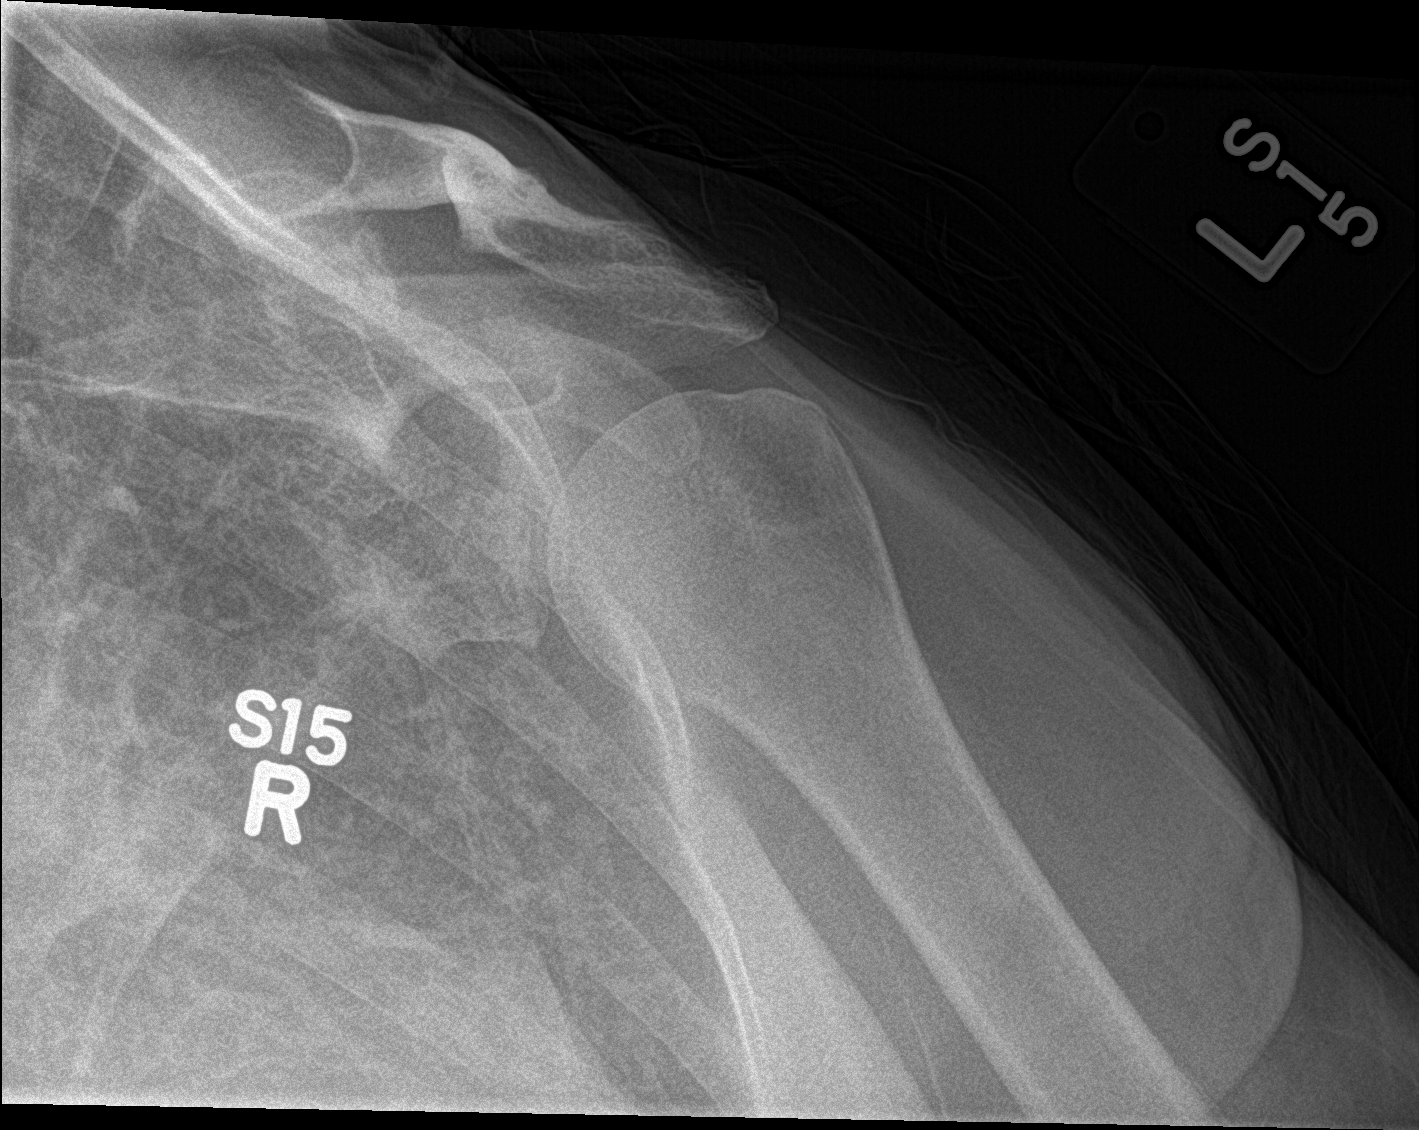

[shoulder y view]
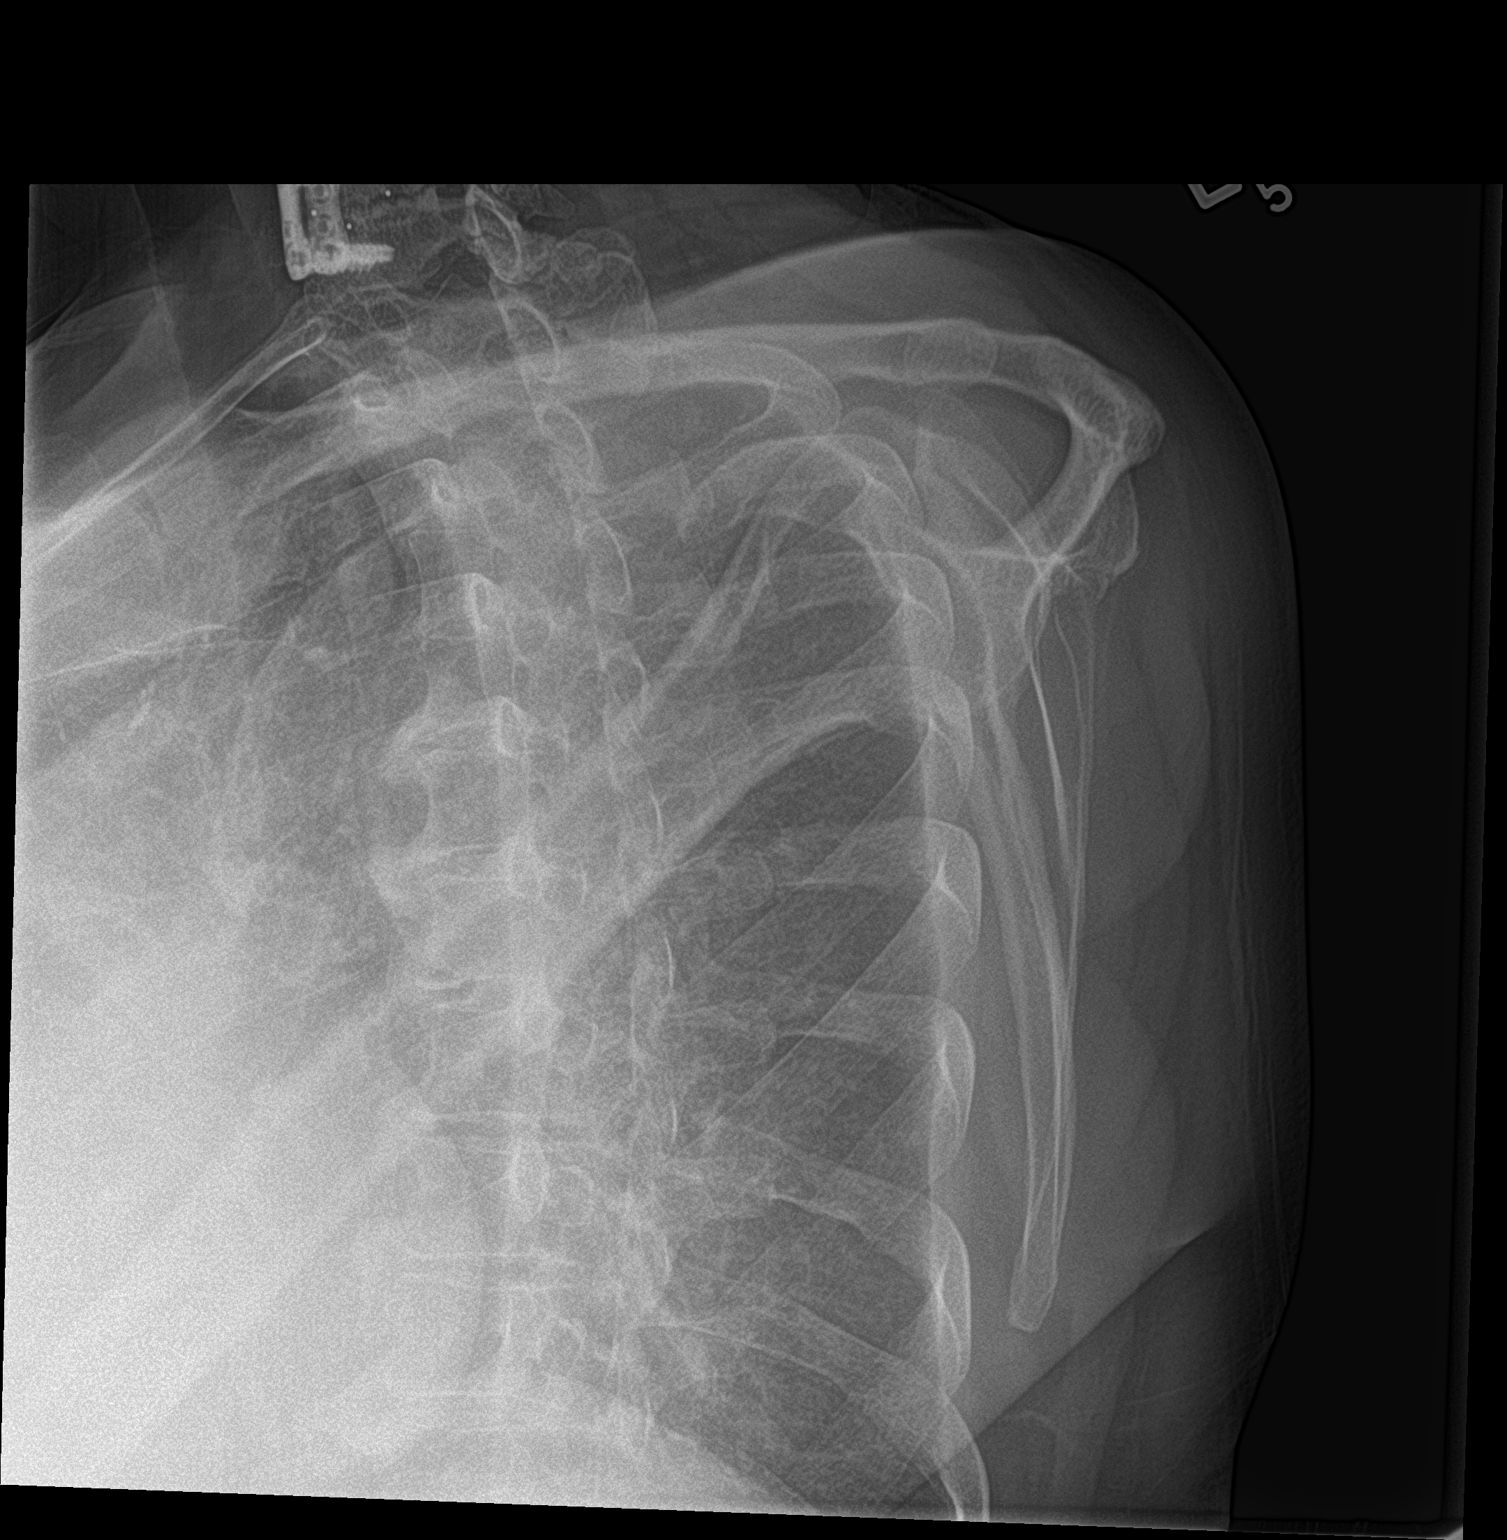

[shoulder axillary]
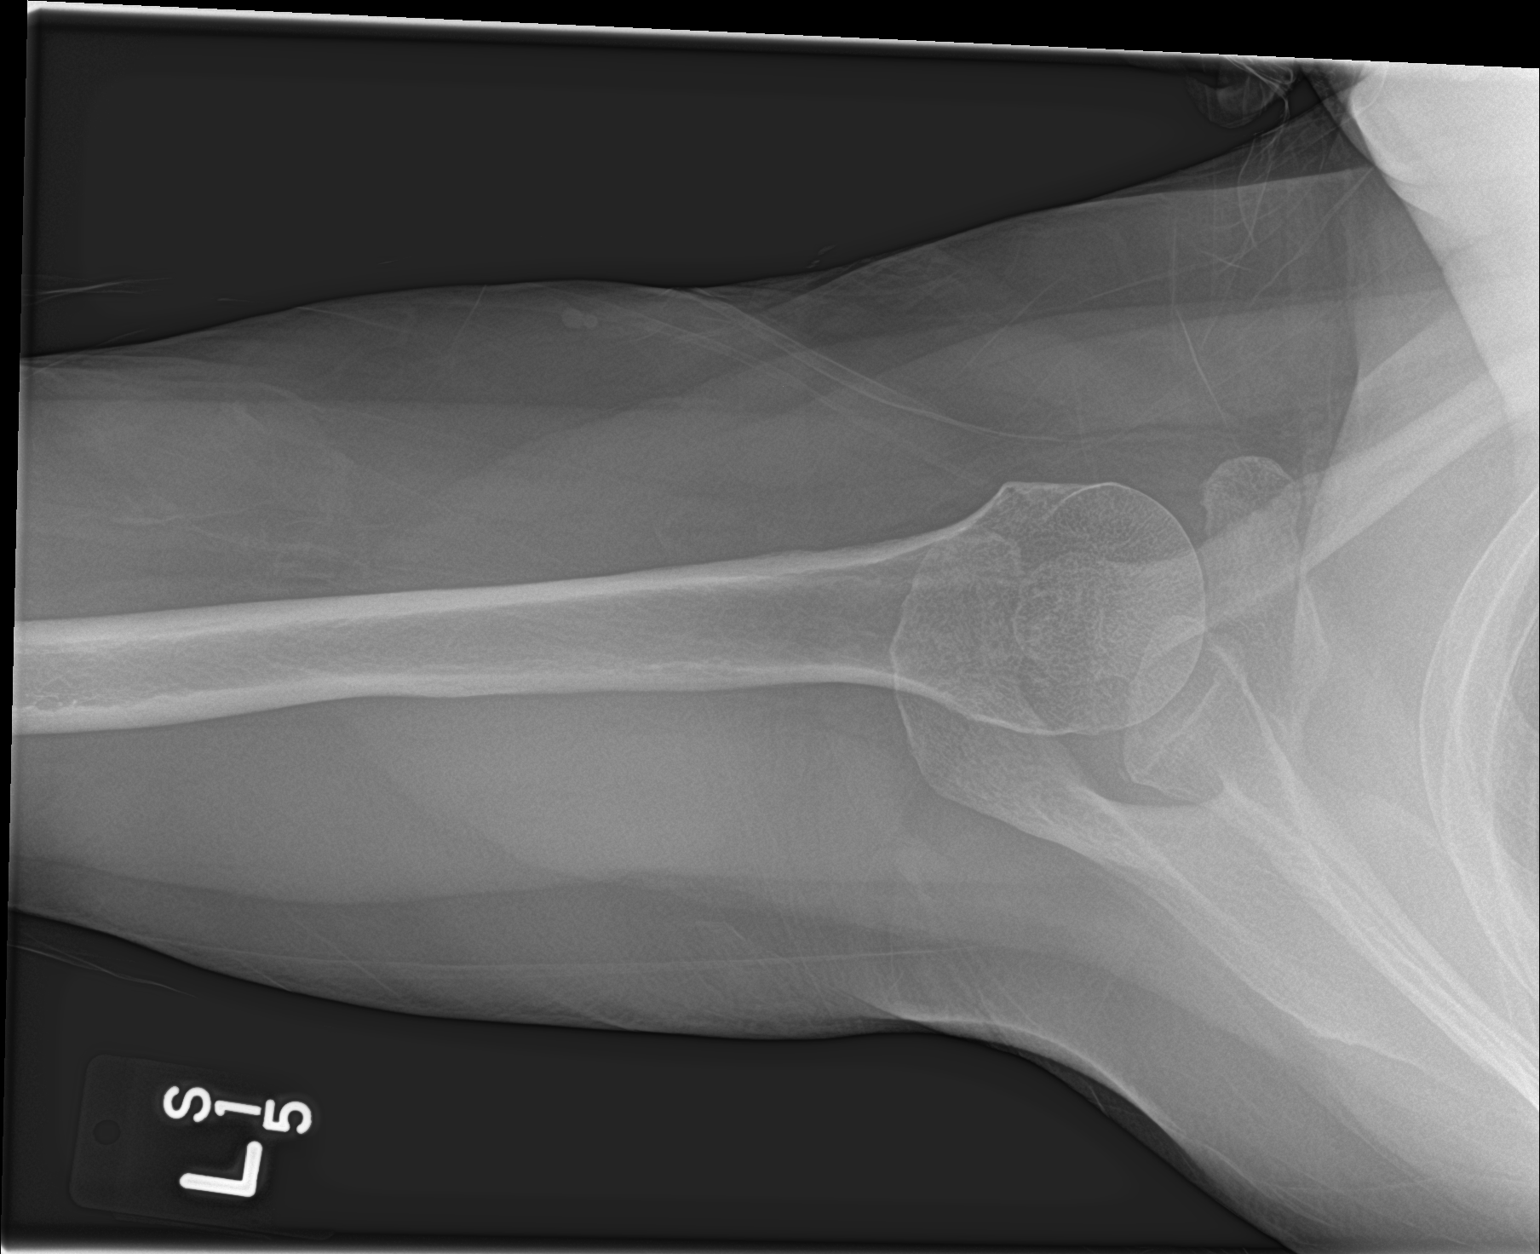

[3 of 3 positions shown; findings below may reference images not displayed]

FINDINGS: There is no evidence of fracture or dislocation. There is no
evidence of arthropathy or other focal bone abnormality. Soft
tissues are unremarkable.
IMPRESSION: Negative.

## 2017-01-15 IMAGING — MR MR LUMBAR SPINE W/O CM
4 of 5 series · 15 of 48 positions shown · non-contrast
Comparison: None.

CLINICAL DATA: Left-sided low back pain since February 2011.
Left-sided sciatica.

EXAM:
MRI LUMBAR SPINE WITHOUT CONTRAST
TECHNIQUE: Multiplanar, multisequence MR imaging of the lumbar spine was
performed. No intravenous contrast was administered.

[Series 3: T2 · sagittal · 4.0mm · 0.70mm/px · 6 of 13 slices shown (1 of 2)]
[im 1/13]
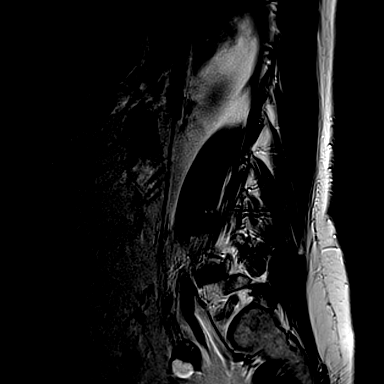
[im 3/13]
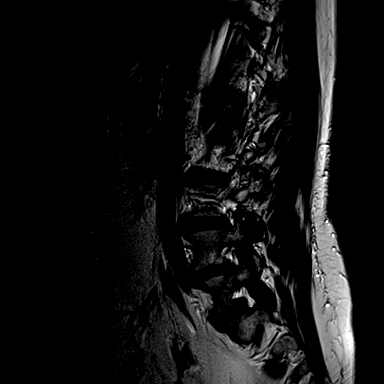
[im 5/13]
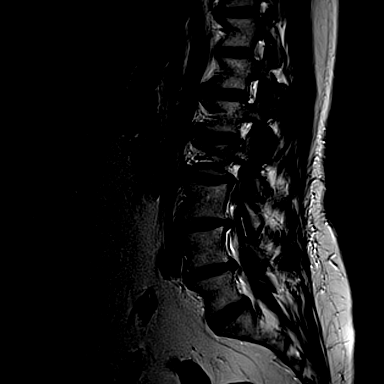
[im 8/13]
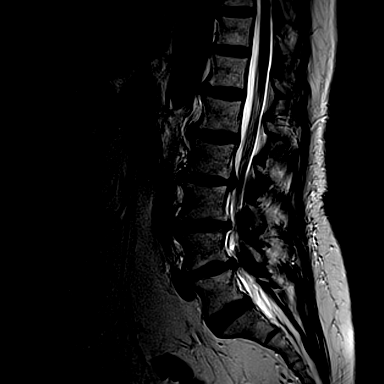
[im 10/13]
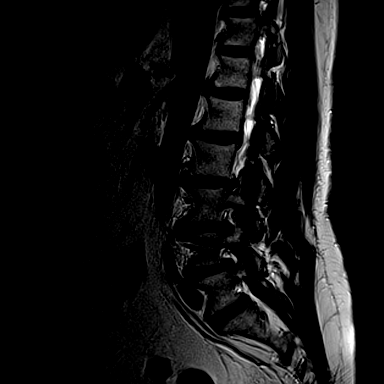
[im 13/13]
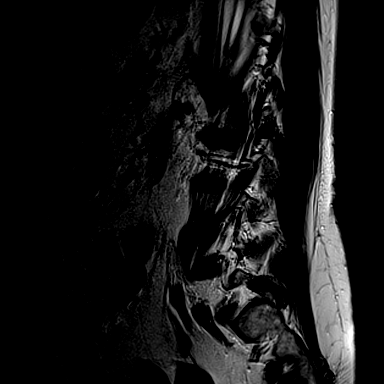

[Series 4: T1 · sagittal · 4.0mm · 0.35mm/px · 3 of 13 slices shown (1 of 2)]
[im 1/13]
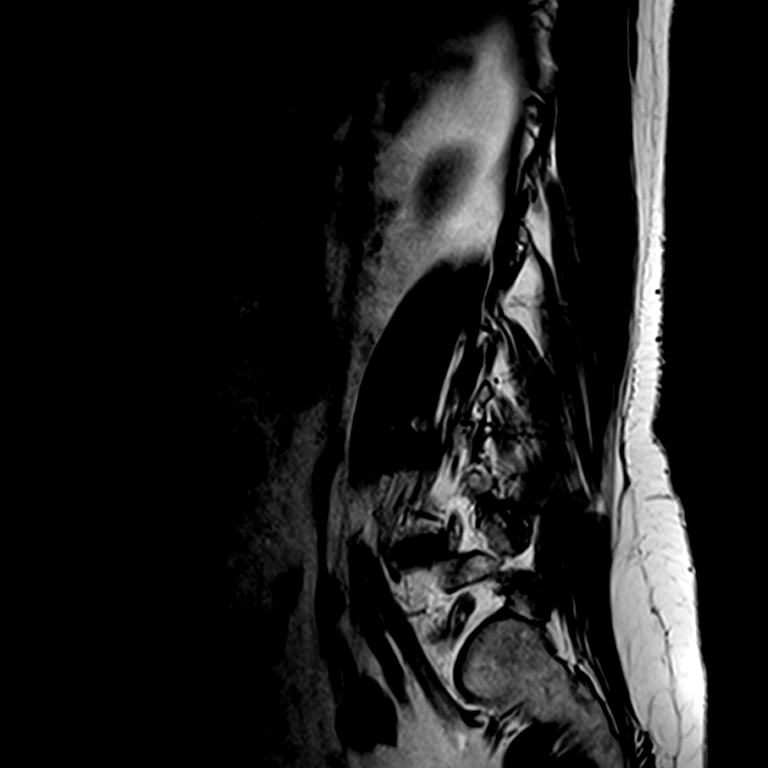
[im 7/13]
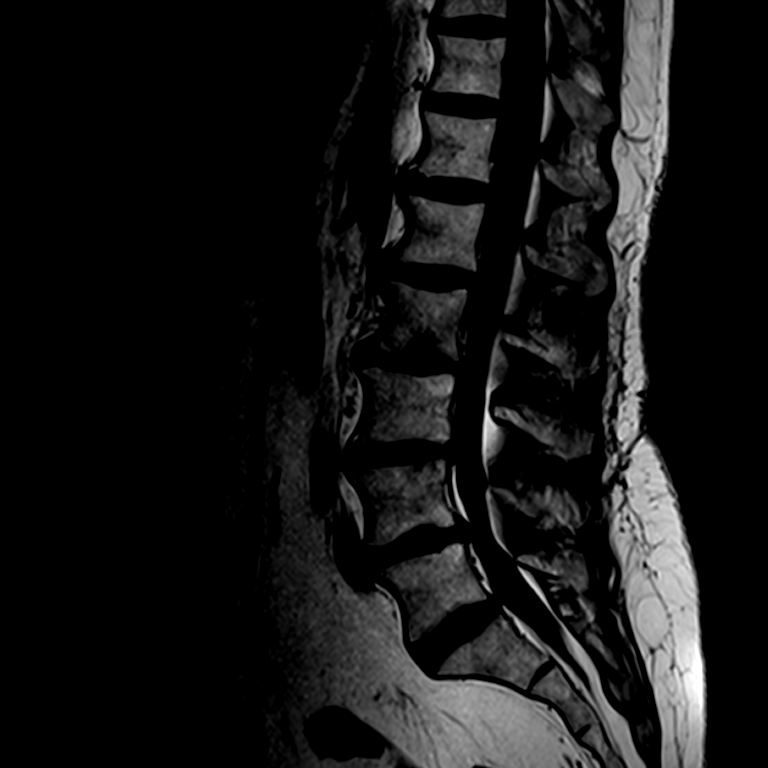
[im 13/13]
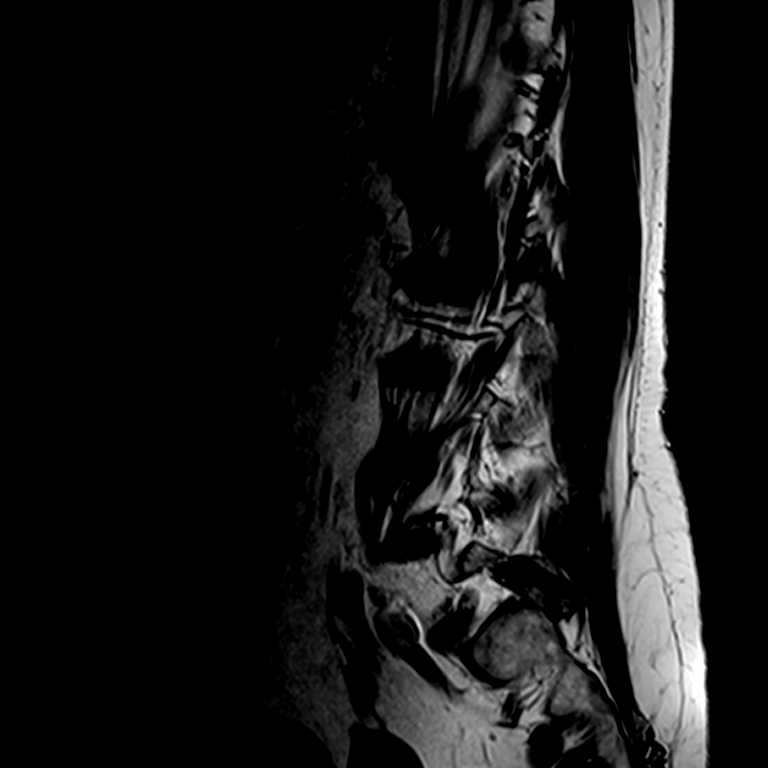

[Series 6: T2 · axial · 4.0mm · 0.24mm/px · z∈[-130,+19]mm · 3 of 40 slices shown (2 of 2)]
[im 6/40]
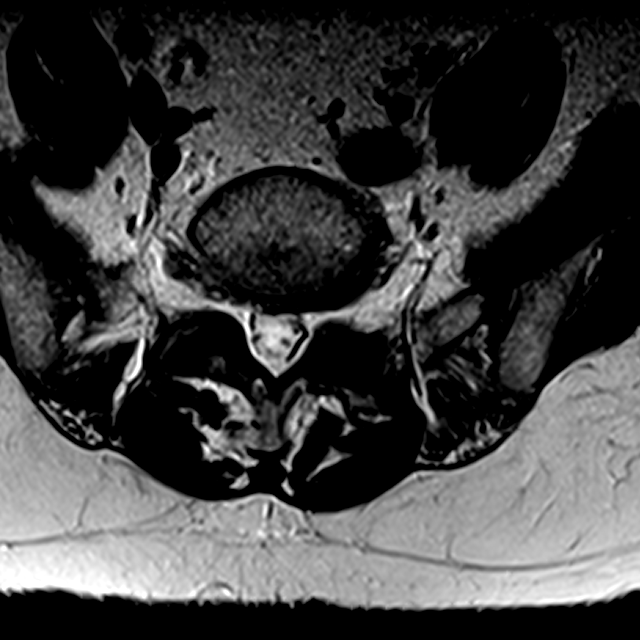
[im 21/40]
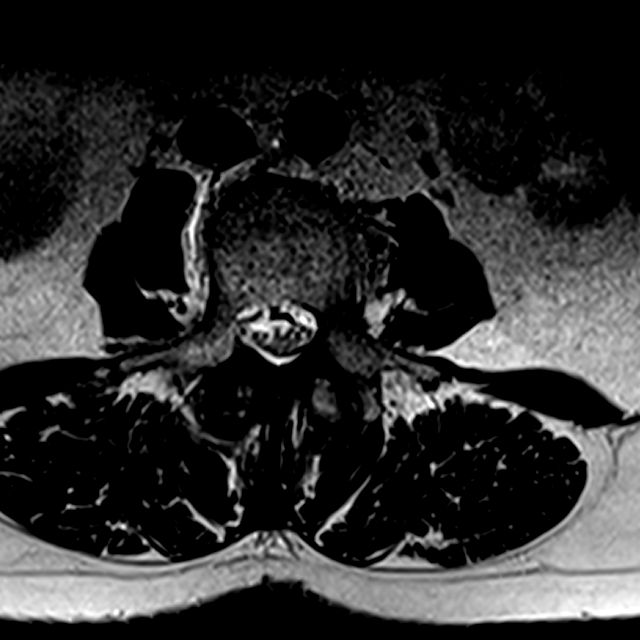
[im 34/40]
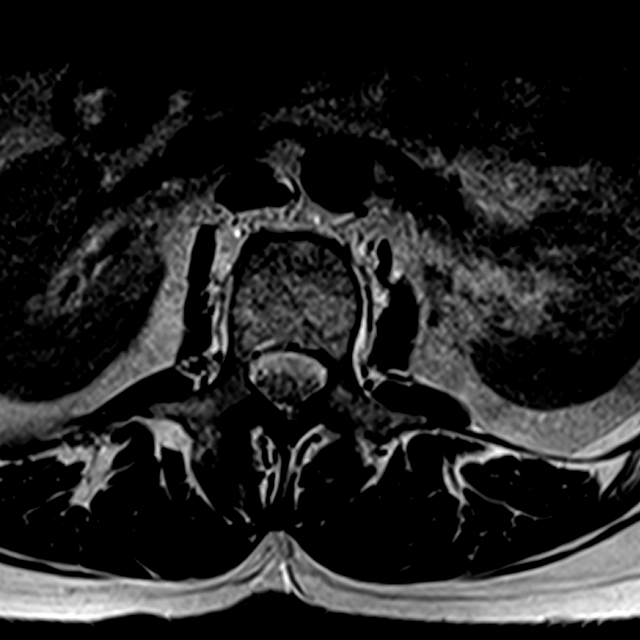

[Series 7: T1 · axial · 4.0mm · 0.26mm/px · z∈[-135,+20]mm · 3 of 40 slices shown (2 of 2)]
[im 6/40]
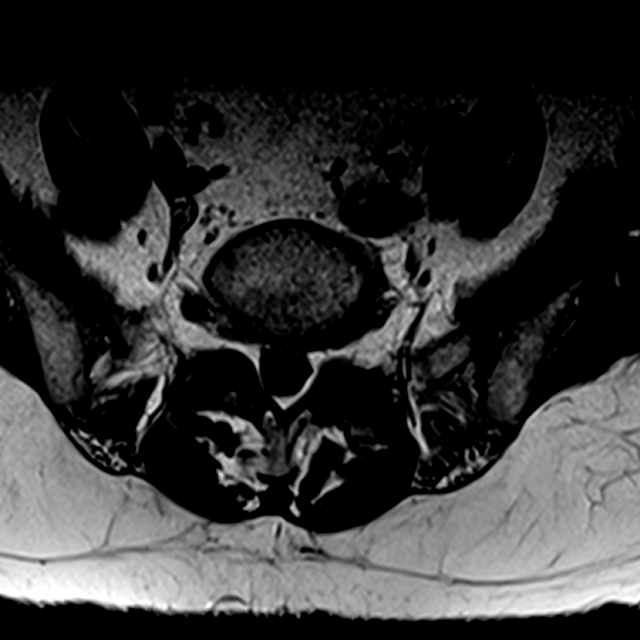
[im 21/40]
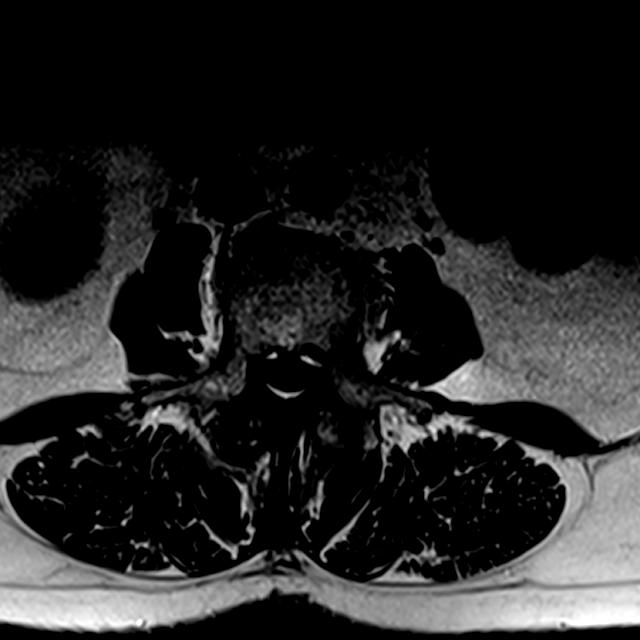
[im 34/40]
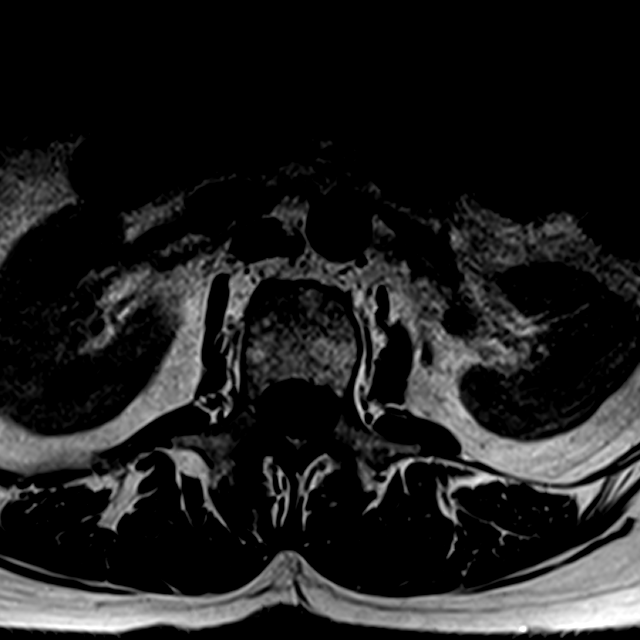

[15 of 48 positions shown; findings below may reference images not displayed]

FINDINGS: Segmentation: Standard.

Alignment: Physiologic.

Vertebrae: No signal abnormality to suggest fracture, discitis, or
mass.

Conus: Extends to the T12-L1 disc level and appears normal.

Paraspinal and retroperitoneal structures: 14 mm partly visualized
cyst in the right ovary, usually incidental at this size.

Disc levels:

T12- L1:

Disc: Ventral spondylosis.  No herniation

Facets: Right arthropathy with spurring

Canal: Patent.

Foramina: No impingement.

L1-L2:

Disc: Mild narrowing and bulging

Facets: Arthropathy with spurring and ligament thickening

Canal: Patent.

Foramina: No impingement.

L2-L3:

Disc: Narrowing and bulging with superimposed central protrusion

Facets: Arthropathy with spurring and ligament thickening

Canal: Mild-to-moderate canal stenosis with cauda equina clumping
and diminished CSF.

Foramina: No impingement.

L3-L4:

Disc: Narrowing bulging and endplate spurring

Facets: Arthropathy with marginal spurring and ligament thickening

Canal: Ventral thecal sac flattening without impingement

Foramina: No impingement.

L4-L5:

Disc: Narrowing and left eccentric bulging

Facets: Arthropathy with marginal spurring and ligament thickening

Canal: Left subarticular recess stenosis with potential L5
impingement

Foramina: No impingement.

L5-S1:

Disc: Narrowing and mild bulging

Facets: Arthropathy with left more than right spurring

Canal: Patent.

Foramina: No impingement.
IMPRESSION: 1. Multilevel disc and facet degeneration as described.
2. Greatest potential for left-sided impingement is L4-5
subarticular recess stenosis.
3. L2-3 mild to moderate canal stenosis.

## 2017-06-20 DIAGNOSIS — I10 Essential (primary) hypertension: Secondary | ICD-10-CM | POA: Diagnosis not present

## 2017-06-20 DIAGNOSIS — E782 Mixed hyperlipidemia: Secondary | ICD-10-CM | POA: Diagnosis not present

## 2017-06-24 DIAGNOSIS — F330 Major depressive disorder, recurrent, mild: Secondary | ICD-10-CM | POA: Diagnosis not present

## 2017-06-24 DIAGNOSIS — G894 Chronic pain syndrome: Secondary | ICD-10-CM | POA: Diagnosis not present

## 2017-06-24 DIAGNOSIS — M542 Cervicalgia: Secondary | ICD-10-CM | POA: Diagnosis not present

## 2017-06-24 DIAGNOSIS — I10 Essential (primary) hypertension: Secondary | ICD-10-CM | POA: Diagnosis not present

## 2017-06-24 DIAGNOSIS — Z6830 Body mass index (BMI) 30.0-30.9, adult: Secondary | ICD-10-CM | POA: Diagnosis not present

## 2017-06-24 DIAGNOSIS — E782 Mixed hyperlipidemia: Secondary | ICD-10-CM | POA: Diagnosis not present

## 2017-06-24 DIAGNOSIS — E119 Type 2 diabetes mellitus without complications: Secondary | ICD-10-CM | POA: Diagnosis not present

## 2017-09-01 DIAGNOSIS — R300 Dysuria: Secondary | ICD-10-CM | POA: Diagnosis not present

## 2017-09-01 DIAGNOSIS — Z6830 Body mass index (BMI) 30.0-30.9, adult: Secondary | ICD-10-CM | POA: Diagnosis not present

## 2017-09-12 DIAGNOSIS — F330 Major depressive disorder, recurrent, mild: Secondary | ICD-10-CM | POA: Diagnosis not present

## 2017-09-12 DIAGNOSIS — I10 Essential (primary) hypertension: Secondary | ICD-10-CM | POA: Diagnosis not present

## 2017-09-12 DIAGNOSIS — E119 Type 2 diabetes mellitus without complications: Secondary | ICD-10-CM | POA: Diagnosis not present

## 2017-09-12 DIAGNOSIS — E782 Mixed hyperlipidemia: Secondary | ICD-10-CM | POA: Diagnosis not present

## 2017-10-06 DIAGNOSIS — Z6830 Body mass index (BMI) 30.0-30.9, adult: Secondary | ICD-10-CM | POA: Diagnosis not present

## 2017-10-06 DIAGNOSIS — J029 Acute pharyngitis, unspecified: Secondary | ICD-10-CM | POA: Diagnosis not present

## 2017-10-06 DIAGNOSIS — J039 Acute tonsillitis, unspecified: Secondary | ICD-10-CM | POA: Diagnosis not present

## 2017-10-27 DIAGNOSIS — F330 Major depressive disorder, recurrent, mild: Secondary | ICD-10-CM | POA: Diagnosis not present

## 2017-10-27 DIAGNOSIS — G894 Chronic pain syndrome: Secondary | ICD-10-CM | POA: Diagnosis not present

## 2017-10-27 DIAGNOSIS — E119 Type 2 diabetes mellitus without complications: Secondary | ICD-10-CM | POA: Diagnosis not present

## 2017-10-27 DIAGNOSIS — I10 Essential (primary) hypertension: Secondary | ICD-10-CM | POA: Diagnosis not present

## 2017-10-27 DIAGNOSIS — E782 Mixed hyperlipidemia: Secondary | ICD-10-CM | POA: Diagnosis not present

## 2017-12-19 DIAGNOSIS — I10 Essential (primary) hypertension: Secondary | ICD-10-CM | POA: Diagnosis not present

## 2017-12-19 DIAGNOSIS — G8929 Other chronic pain: Secondary | ICD-10-CM | POA: Diagnosis not present

## 2017-12-19 DIAGNOSIS — R945 Abnormal results of liver function studies: Secondary | ICD-10-CM | POA: Diagnosis not present

## 2017-12-19 DIAGNOSIS — Z713 Dietary counseling and surveillance: Secondary | ICD-10-CM | POA: Diagnosis not present

## 2017-12-19 DIAGNOSIS — G894 Chronic pain syndrome: Secondary | ICD-10-CM | POA: Diagnosis not present

## 2017-12-19 DIAGNOSIS — M542 Cervicalgia: Secondary | ICD-10-CM | POA: Diagnosis not present

## 2017-12-19 DIAGNOSIS — R300 Dysuria: Secondary | ICD-10-CM | POA: Diagnosis not present

## 2017-12-19 DIAGNOSIS — J039 Acute tonsillitis, unspecified: Secondary | ICD-10-CM | POA: Diagnosis not present

## 2017-12-19 DIAGNOSIS — E119 Type 2 diabetes mellitus without complications: Secondary | ICD-10-CM | POA: Diagnosis not present

## 2017-12-19 DIAGNOSIS — E782 Mixed hyperlipidemia: Secondary | ICD-10-CM | POA: Diagnosis not present

## 2017-12-19 DIAGNOSIS — J029 Acute pharyngitis, unspecified: Secondary | ICD-10-CM | POA: Diagnosis not present

## 2017-12-19 DIAGNOSIS — F330 Major depressive disorder, recurrent, mild: Secondary | ICD-10-CM | POA: Diagnosis not present

## 2017-12-22 DIAGNOSIS — E782 Mixed hyperlipidemia: Secondary | ICD-10-CM | POA: Diagnosis not present

## 2017-12-22 DIAGNOSIS — E1169 Type 2 diabetes mellitus with other specified complication: Secondary | ICD-10-CM | POA: Diagnosis not present

## 2017-12-22 DIAGNOSIS — I10 Essential (primary) hypertension: Secondary | ICD-10-CM | POA: Diagnosis not present

## 2017-12-26 DIAGNOSIS — E669 Obesity, unspecified: Secondary | ICD-10-CM | POA: Diagnosis not present

## 2017-12-26 DIAGNOSIS — E782 Mixed hyperlipidemia: Secondary | ICD-10-CM | POA: Diagnosis not present

## 2017-12-26 DIAGNOSIS — M542 Cervicalgia: Secondary | ICD-10-CM | POA: Diagnosis not present

## 2017-12-26 DIAGNOSIS — I10 Essential (primary) hypertension: Secondary | ICD-10-CM | POA: Diagnosis not present

## 2017-12-26 DIAGNOSIS — E1169 Type 2 diabetes mellitus with other specified complication: Secondary | ICD-10-CM | POA: Diagnosis not present

## 2017-12-26 DIAGNOSIS — F330 Major depressive disorder, recurrent, mild: Secondary | ICD-10-CM | POA: Diagnosis not present

## 2017-12-26 DIAGNOSIS — G8929 Other chronic pain: Secondary | ICD-10-CM | POA: Diagnosis not present

## 2018-01-22 DIAGNOSIS — E1169 Type 2 diabetes mellitus with other specified complication: Secondary | ICD-10-CM | POA: Diagnosis not present

## 2018-01-22 DIAGNOSIS — I10 Essential (primary) hypertension: Secondary | ICD-10-CM | POA: Diagnosis not present

## 2018-01-22 DIAGNOSIS — E782 Mixed hyperlipidemia: Secondary | ICD-10-CM | POA: Diagnosis not present

## 2018-04-20 DIAGNOSIS — E119 Type 2 diabetes mellitus without complications: Secondary | ICD-10-CM | POA: Diagnosis not present

## 2018-04-20 DIAGNOSIS — E1169 Type 2 diabetes mellitus with other specified complication: Secondary | ICD-10-CM | POA: Diagnosis not present

## 2018-04-22 DIAGNOSIS — I10 Essential (primary) hypertension: Secondary | ICD-10-CM | POA: Diagnosis not present

## 2018-04-22 DIAGNOSIS — E782 Mixed hyperlipidemia: Secondary | ICD-10-CM | POA: Diagnosis not present

## 2018-04-22 DIAGNOSIS — E119 Type 2 diabetes mellitus without complications: Secondary | ICD-10-CM | POA: Diagnosis not present

## 2018-04-22 DIAGNOSIS — F330 Major depressive disorder, recurrent, mild: Secondary | ICD-10-CM | POA: Diagnosis not present

## 2018-05-21 DIAGNOSIS — I10 Essential (primary) hypertension: Secondary | ICD-10-CM | POA: Diagnosis not present

## 2018-05-21 DIAGNOSIS — E782 Mixed hyperlipidemia: Secondary | ICD-10-CM | POA: Diagnosis not present

## 2018-05-21 DIAGNOSIS — Z0001 Encounter for general adult medical examination with abnormal findings: Secondary | ICD-10-CM | POA: Diagnosis not present

## 2018-05-21 DIAGNOSIS — F330 Major depressive disorder, recurrent, mild: Secondary | ICD-10-CM | POA: Diagnosis not present

## 2018-05-21 DIAGNOSIS — G4733 Obstructive sleep apnea (adult) (pediatric): Secondary | ICD-10-CM | POA: Diagnosis not present

## 2018-05-21 DIAGNOSIS — E119 Type 2 diabetes mellitus without complications: Secondary | ICD-10-CM | POA: Diagnosis not present

## 2018-07-03 DIAGNOSIS — I10 Essential (primary) hypertension: Secondary | ICD-10-CM | POA: Diagnosis not present

## 2018-07-03 DIAGNOSIS — E782 Mixed hyperlipidemia: Secondary | ICD-10-CM | POA: Diagnosis not present

## 2018-07-03 DIAGNOSIS — E1169 Type 2 diabetes mellitus with other specified complication: Secondary | ICD-10-CM | POA: Diagnosis not present

## 2018-07-03 DIAGNOSIS — E119 Type 2 diabetes mellitus without complications: Secondary | ICD-10-CM | POA: Diagnosis not present

## 2018-07-10 DIAGNOSIS — J039 Acute tonsillitis, unspecified: Secondary | ICD-10-CM | POA: Diagnosis not present

## 2018-07-10 DIAGNOSIS — E782 Mixed hyperlipidemia: Secondary | ICD-10-CM | POA: Diagnosis not present

## 2018-07-10 DIAGNOSIS — E669 Obesity, unspecified: Secondary | ICD-10-CM | POA: Diagnosis not present

## 2018-07-10 DIAGNOSIS — E1169 Type 2 diabetes mellitus with other specified complication: Secondary | ICD-10-CM | POA: Diagnosis not present

## 2018-07-10 DIAGNOSIS — Z0001 Encounter for general adult medical examination with abnormal findings: Secondary | ICD-10-CM | POA: Diagnosis not present

## 2018-07-10 DIAGNOSIS — J029 Acute pharyngitis, unspecified: Secondary | ICD-10-CM | POA: Diagnosis not present

## 2018-07-10 DIAGNOSIS — M542 Cervicalgia: Secondary | ICD-10-CM | POA: Diagnosis not present

## 2018-07-10 DIAGNOSIS — Z713 Dietary counseling and surveillance: Secondary | ICD-10-CM | POA: Diagnosis not present

## 2018-07-10 DIAGNOSIS — E119 Type 2 diabetes mellitus without complications: Secondary | ICD-10-CM | POA: Diagnosis not present

## 2018-07-10 DIAGNOSIS — I10 Essential (primary) hypertension: Secondary | ICD-10-CM | POA: Diagnosis not present

## 2018-07-10 DIAGNOSIS — G894 Chronic pain syndrome: Secondary | ICD-10-CM | POA: Diagnosis not present

## 2018-07-10 DIAGNOSIS — G8929 Other chronic pain: Secondary | ICD-10-CM | POA: Diagnosis not present

## 2018-07-10 DIAGNOSIS — F330 Major depressive disorder, recurrent, mild: Secondary | ICD-10-CM | POA: Diagnosis not present

## 2018-07-10 DIAGNOSIS — R300 Dysuria: Secondary | ICD-10-CM | POA: Diagnosis not present

## 2018-07-13 ENCOUNTER — Other Ambulatory Visit: Payer: Self-pay | Admitting: Internal Medicine

## 2018-07-13 DIAGNOSIS — E2839 Other primary ovarian failure: Secondary | ICD-10-CM

## 2018-07-13 DIAGNOSIS — Z780 Asymptomatic menopausal state: Secondary | ICD-10-CM

## 2018-09-04 DIAGNOSIS — E1169 Type 2 diabetes mellitus with other specified complication: Secondary | ICD-10-CM | POA: Diagnosis not present

## 2018-09-04 DIAGNOSIS — G8929 Other chronic pain: Secondary | ICD-10-CM | POA: Diagnosis not present

## 2018-09-04 DIAGNOSIS — J039 Acute tonsillitis, unspecified: Secondary | ICD-10-CM | POA: Diagnosis not present

## 2018-09-04 DIAGNOSIS — R300 Dysuria: Secondary | ICD-10-CM | POA: Diagnosis not present

## 2018-09-04 DIAGNOSIS — E119 Type 2 diabetes mellitus without complications: Secondary | ICD-10-CM | POA: Diagnosis not present

## 2018-09-04 DIAGNOSIS — F330 Major depressive disorder, recurrent, mild: Secondary | ICD-10-CM | POA: Diagnosis not present

## 2018-09-04 DIAGNOSIS — I10 Essential (primary) hypertension: Secondary | ICD-10-CM | POA: Diagnosis not present

## 2018-09-04 DIAGNOSIS — Z0001 Encounter for general adult medical examination with abnormal findings: Secondary | ICD-10-CM | POA: Diagnosis not present

## 2018-09-04 DIAGNOSIS — J029 Acute pharyngitis, unspecified: Secondary | ICD-10-CM | POA: Diagnosis not present

## 2018-09-04 DIAGNOSIS — E782 Mixed hyperlipidemia: Secondary | ICD-10-CM | POA: Diagnosis not present

## 2018-09-04 DIAGNOSIS — Z713 Dietary counseling and surveillance: Secondary | ICD-10-CM | POA: Diagnosis not present

## 2018-09-04 DIAGNOSIS — G894 Chronic pain syndrome: Secondary | ICD-10-CM | POA: Diagnosis not present

## 2018-10-29 DIAGNOSIS — E119 Type 2 diabetes mellitus without complications: Secondary | ICD-10-CM | POA: Diagnosis not present

## 2018-10-29 DIAGNOSIS — E782 Mixed hyperlipidemia: Secondary | ICD-10-CM | POA: Diagnosis not present

## 2018-10-29 DIAGNOSIS — I10 Essential (primary) hypertension: Secondary | ICD-10-CM | POA: Diagnosis not present

## 2018-10-29 DIAGNOSIS — F330 Major depressive disorder, recurrent, mild: Secondary | ICD-10-CM | POA: Diagnosis not present

## 2018-12-18 DIAGNOSIS — I10 Essential (primary) hypertension: Secondary | ICD-10-CM | POA: Diagnosis not present

## 2018-12-18 DIAGNOSIS — F330 Major depressive disorder, recurrent, mild: Secondary | ICD-10-CM | POA: Diagnosis not present

## 2018-12-18 DIAGNOSIS — E119 Type 2 diabetes mellitus without complications: Secondary | ICD-10-CM | POA: Diagnosis not present

## 2018-12-18 DIAGNOSIS — E782 Mixed hyperlipidemia: Secondary | ICD-10-CM | POA: Diagnosis not present

## 2019-01-11 DIAGNOSIS — E1169 Type 2 diabetes mellitus with other specified complication: Secondary | ICD-10-CM | POA: Diagnosis not present

## 2019-01-11 DIAGNOSIS — E119 Type 2 diabetes mellitus without complications: Secondary | ICD-10-CM | POA: Diagnosis not present

## 2019-01-11 DIAGNOSIS — E782 Mixed hyperlipidemia: Secondary | ICD-10-CM | POA: Diagnosis not present

## 2019-01-11 DIAGNOSIS — I10 Essential (primary) hypertension: Secondary | ICD-10-CM | POA: Diagnosis not present

## 2019-01-15 DIAGNOSIS — F330 Major depressive disorder, recurrent, mild: Secondary | ICD-10-CM | POA: Diagnosis not present

## 2019-01-15 DIAGNOSIS — G8929 Other chronic pain: Secondary | ICD-10-CM | POA: Diagnosis not present

## 2019-01-15 DIAGNOSIS — E1169 Type 2 diabetes mellitus with other specified complication: Secondary | ICD-10-CM | POA: Diagnosis not present

## 2019-01-15 DIAGNOSIS — F17210 Nicotine dependence, cigarettes, uncomplicated: Secondary | ICD-10-CM | POA: Diagnosis not present

## 2019-01-15 DIAGNOSIS — M542 Cervicalgia: Secondary | ICD-10-CM | POA: Diagnosis not present

## 2019-01-15 DIAGNOSIS — I10 Essential (primary) hypertension: Secondary | ICD-10-CM | POA: Diagnosis not present

## 2019-01-15 DIAGNOSIS — E782 Mixed hyperlipidemia: Secondary | ICD-10-CM | POA: Diagnosis not present

## 2019-01-15 DIAGNOSIS — D72829 Elevated white blood cell count, unspecified: Secondary | ICD-10-CM | POA: Diagnosis not present

## 2019-02-19 DIAGNOSIS — F330 Major depressive disorder, recurrent, mild: Secondary | ICD-10-CM | POA: Diagnosis not present

## 2019-02-19 DIAGNOSIS — E782 Mixed hyperlipidemia: Secondary | ICD-10-CM | POA: Diagnosis not present

## 2019-02-19 DIAGNOSIS — E119 Type 2 diabetes mellitus without complications: Secondary | ICD-10-CM | POA: Diagnosis not present

## 2019-02-19 DIAGNOSIS — I10 Essential (primary) hypertension: Secondary | ICD-10-CM | POA: Diagnosis not present

## 2019-04-26 DIAGNOSIS — E782 Mixed hyperlipidemia: Secondary | ICD-10-CM | POA: Diagnosis not present

## 2019-04-26 DIAGNOSIS — I10 Essential (primary) hypertension: Secondary | ICD-10-CM | POA: Diagnosis not present

## 2019-04-26 DIAGNOSIS — E119 Type 2 diabetes mellitus without complications: Secondary | ICD-10-CM | POA: Diagnosis not present

## 2019-04-26 DIAGNOSIS — F330 Major depressive disorder, recurrent, mild: Secondary | ICD-10-CM | POA: Diagnosis not present

## 2019-06-10 DIAGNOSIS — E119 Type 2 diabetes mellitus without complications: Secondary | ICD-10-CM | POA: Diagnosis not present

## 2019-06-10 DIAGNOSIS — F330 Major depressive disorder, recurrent, mild: Secondary | ICD-10-CM | POA: Diagnosis not present

## 2019-06-10 DIAGNOSIS — E782 Mixed hyperlipidemia: Secondary | ICD-10-CM | POA: Diagnosis not present

## 2019-06-10 DIAGNOSIS — I10 Essential (primary) hypertension: Secondary | ICD-10-CM | POA: Diagnosis not present

## 2019-06-24 DIAGNOSIS — E782 Mixed hyperlipidemia: Secondary | ICD-10-CM | POA: Diagnosis not present

## 2019-06-24 DIAGNOSIS — F330 Major depressive disorder, recurrent, mild: Secondary | ICD-10-CM | POA: Diagnosis not present

## 2019-06-24 DIAGNOSIS — I10 Essential (primary) hypertension: Secondary | ICD-10-CM | POA: Diagnosis not present

## 2019-06-24 DIAGNOSIS — E119 Type 2 diabetes mellitus without complications: Secondary | ICD-10-CM | POA: Diagnosis not present

## 2019-08-04 DIAGNOSIS — D72829 Elevated white blood cell count, unspecified: Secondary | ICD-10-CM | POA: Diagnosis not present

## 2019-08-04 DIAGNOSIS — I10 Essential (primary) hypertension: Secondary | ICD-10-CM | POA: Diagnosis not present

## 2019-08-04 DIAGNOSIS — G894 Chronic pain syndrome: Secondary | ICD-10-CM | POA: Diagnosis not present

## 2019-08-04 DIAGNOSIS — E119 Type 2 diabetes mellitus without complications: Secondary | ICD-10-CM | POA: Diagnosis not present

## 2019-08-04 DIAGNOSIS — F17210 Nicotine dependence, cigarettes, uncomplicated: Secondary | ICD-10-CM | POA: Diagnosis not present

## 2019-08-04 DIAGNOSIS — H9202 Otalgia, left ear: Secondary | ICD-10-CM | POA: Diagnosis not present

## 2019-08-04 DIAGNOSIS — J029 Acute pharyngitis, unspecified: Secondary | ICD-10-CM | POA: Diagnosis not present

## 2019-08-04 DIAGNOSIS — E782 Mixed hyperlipidemia: Secondary | ICD-10-CM | POA: Diagnosis not present

## 2019-08-04 DIAGNOSIS — G8929 Other chronic pain: Secondary | ICD-10-CM | POA: Diagnosis not present

## 2019-08-04 DIAGNOSIS — F330 Major depressive disorder, recurrent, mild: Secondary | ICD-10-CM | POA: Diagnosis not present

## 2019-08-04 DIAGNOSIS — E669 Obesity, unspecified: Secondary | ICD-10-CM | POA: Diagnosis not present

## 2019-08-04 DIAGNOSIS — E1169 Type 2 diabetes mellitus with other specified complication: Secondary | ICD-10-CM | POA: Diagnosis not present

## 2019-08-09 DIAGNOSIS — F17210 Nicotine dependence, cigarettes, uncomplicated: Secondary | ICD-10-CM | POA: Diagnosis not present

## 2019-08-09 DIAGNOSIS — F330 Major depressive disorder, recurrent, mild: Secondary | ICD-10-CM | POA: Diagnosis not present

## 2019-08-09 DIAGNOSIS — Z0001 Encounter for general adult medical examination with abnormal findings: Secondary | ICD-10-CM | POA: Diagnosis not present

## 2019-08-09 DIAGNOSIS — E119 Type 2 diabetes mellitus without complications: Secondary | ICD-10-CM | POA: Diagnosis not present

## 2019-08-09 DIAGNOSIS — G8929 Other chronic pain: Secondary | ICD-10-CM | POA: Diagnosis not present

## 2019-08-09 DIAGNOSIS — M542 Cervicalgia: Secondary | ICD-10-CM | POA: Diagnosis not present

## 2019-08-09 DIAGNOSIS — K30 Functional dyspepsia: Secondary | ICD-10-CM | POA: Diagnosis not present

## 2019-08-09 DIAGNOSIS — I10 Essential (primary) hypertension: Secondary | ICD-10-CM | POA: Diagnosis not present

## 2019-08-09 DIAGNOSIS — D72829 Elevated white blood cell count, unspecified: Secondary | ICD-10-CM | POA: Diagnosis not present

## 2019-08-09 DIAGNOSIS — E782 Mixed hyperlipidemia: Secondary | ICD-10-CM | POA: Diagnosis not present

## 2019-08-09 DIAGNOSIS — Z6830 Body mass index (BMI) 30.0-30.9, adult: Secondary | ICD-10-CM | POA: Diagnosis not present

## 2019-08-09 DIAGNOSIS — E1169 Type 2 diabetes mellitus with other specified complication: Secondary | ICD-10-CM | POA: Diagnosis not present

## 2019-08-09 DIAGNOSIS — E6609 Other obesity due to excess calories: Secondary | ICD-10-CM | POA: Diagnosis not present

## 2019-08-17 DIAGNOSIS — I10 Essential (primary) hypertension: Secondary | ICD-10-CM | POA: Diagnosis not present

## 2019-08-17 DIAGNOSIS — F330 Major depressive disorder, recurrent, mild: Secondary | ICD-10-CM | POA: Diagnosis not present

## 2019-08-17 DIAGNOSIS — E119 Type 2 diabetes mellitus without complications: Secondary | ICD-10-CM | POA: Diagnosis not present

## 2019-08-17 DIAGNOSIS — E782 Mixed hyperlipidemia: Secondary | ICD-10-CM | POA: Diagnosis not present

## 2019-09-27 DIAGNOSIS — E782 Mixed hyperlipidemia: Secondary | ICD-10-CM | POA: Diagnosis not present

## 2019-09-27 DIAGNOSIS — I10 Essential (primary) hypertension: Secondary | ICD-10-CM | POA: Diagnosis not present

## 2019-09-27 DIAGNOSIS — E119 Type 2 diabetes mellitus without complications: Secondary | ICD-10-CM | POA: Diagnosis not present

## 2019-09-27 DIAGNOSIS — F330 Major depressive disorder, recurrent, mild: Secondary | ICD-10-CM | POA: Diagnosis not present

## 2019-10-28 DIAGNOSIS — I10 Essential (primary) hypertension: Secondary | ICD-10-CM | POA: Diagnosis not present

## 2019-10-28 DIAGNOSIS — F330 Major depressive disorder, recurrent, mild: Secondary | ICD-10-CM | POA: Diagnosis not present

## 2019-10-28 DIAGNOSIS — E782 Mixed hyperlipidemia: Secondary | ICD-10-CM | POA: Diagnosis not present

## 2019-10-28 DIAGNOSIS — E119 Type 2 diabetes mellitus without complications: Secondary | ICD-10-CM | POA: Diagnosis not present

## 2019-11-19 DIAGNOSIS — E119 Type 2 diabetes mellitus without complications: Secondary | ICD-10-CM | POA: Diagnosis not present

## 2019-11-19 DIAGNOSIS — E782 Mixed hyperlipidemia: Secondary | ICD-10-CM | POA: Diagnosis not present

## 2019-11-19 DIAGNOSIS — I10 Essential (primary) hypertension: Secondary | ICD-10-CM | POA: Diagnosis not present

## 2019-11-19 DIAGNOSIS — F330 Major depressive disorder, recurrent, mild: Secondary | ICD-10-CM | POA: Diagnosis not present

## 2020-01-05 DIAGNOSIS — I10 Essential (primary) hypertension: Secondary | ICD-10-CM | POA: Diagnosis not present

## 2020-01-05 DIAGNOSIS — E119 Type 2 diabetes mellitus without complications: Secondary | ICD-10-CM | POA: Diagnosis not present

## 2020-01-05 DIAGNOSIS — F330 Major depressive disorder, recurrent, mild: Secondary | ICD-10-CM | POA: Diagnosis not present

## 2020-01-05 DIAGNOSIS — E782 Mixed hyperlipidemia: Secondary | ICD-10-CM | POA: Diagnosis not present

## 2020-02-02 DIAGNOSIS — E782 Mixed hyperlipidemia: Secondary | ICD-10-CM | POA: Diagnosis not present

## 2020-02-02 DIAGNOSIS — J029 Acute pharyngitis, unspecified: Secondary | ICD-10-CM | POA: Diagnosis not present

## 2020-02-02 DIAGNOSIS — J039 Acute tonsillitis, unspecified: Secondary | ICD-10-CM | POA: Diagnosis not present

## 2020-02-02 DIAGNOSIS — E6609 Other obesity due to excess calories: Secondary | ICD-10-CM | POA: Diagnosis not present

## 2020-02-02 DIAGNOSIS — F17210 Nicotine dependence, cigarettes, uncomplicated: Secondary | ICD-10-CM | POA: Diagnosis not present

## 2020-02-02 DIAGNOSIS — E119 Type 2 diabetes mellitus without complications: Secondary | ICD-10-CM | POA: Diagnosis not present

## 2020-02-02 DIAGNOSIS — Z0001 Encounter for general adult medical examination with abnormal findings: Secondary | ICD-10-CM | POA: Diagnosis not present

## 2020-02-02 DIAGNOSIS — Z713 Dietary counseling and surveillance: Secondary | ICD-10-CM | POA: Diagnosis not present

## 2020-02-02 DIAGNOSIS — E1169 Type 2 diabetes mellitus with other specified complication: Secondary | ICD-10-CM | POA: Diagnosis not present

## 2020-02-02 DIAGNOSIS — R300 Dysuria: Secondary | ICD-10-CM | POA: Diagnosis not present

## 2020-02-02 DIAGNOSIS — G8929 Other chronic pain: Secondary | ICD-10-CM | POA: Diagnosis not present

## 2020-02-02 DIAGNOSIS — I10 Essential (primary) hypertension: Secondary | ICD-10-CM | POA: Diagnosis not present

## 2020-02-09 DIAGNOSIS — M542 Cervicalgia: Secondary | ICD-10-CM | POA: Diagnosis not present

## 2020-02-09 DIAGNOSIS — I10 Essential (primary) hypertension: Secondary | ICD-10-CM | POA: Diagnosis not present

## 2020-02-09 DIAGNOSIS — G8929 Other chronic pain: Secondary | ICD-10-CM | POA: Diagnosis not present

## 2020-02-09 DIAGNOSIS — E6609 Other obesity due to excess calories: Secondary | ICD-10-CM | POA: Diagnosis not present

## 2020-02-09 DIAGNOSIS — Z6830 Body mass index (BMI) 30.0-30.9, adult: Secondary | ICD-10-CM | POA: Diagnosis not present

## 2020-02-09 DIAGNOSIS — E782 Mixed hyperlipidemia: Secondary | ICD-10-CM | POA: Diagnosis not present

## 2020-02-09 DIAGNOSIS — D72829 Elevated white blood cell count, unspecified: Secondary | ICD-10-CM | POA: Diagnosis not present

## 2020-02-09 DIAGNOSIS — E559 Vitamin D deficiency, unspecified: Secondary | ICD-10-CM | POA: Diagnosis not present

## 2020-02-09 DIAGNOSIS — F17210 Nicotine dependence, cigarettes, uncomplicated: Secondary | ICD-10-CM | POA: Diagnosis not present

## 2020-02-09 DIAGNOSIS — E1169 Type 2 diabetes mellitus with other specified complication: Secondary | ICD-10-CM | POA: Diagnosis not present

## 2020-02-09 DIAGNOSIS — F330 Major depressive disorder, recurrent, mild: Secondary | ICD-10-CM | POA: Diagnosis not present

## 2020-02-11 DIAGNOSIS — F330 Major depressive disorder, recurrent, mild: Secondary | ICD-10-CM | POA: Diagnosis not present

## 2020-02-11 DIAGNOSIS — I10 Essential (primary) hypertension: Secondary | ICD-10-CM | POA: Diagnosis not present

## 2020-02-11 DIAGNOSIS — E119 Type 2 diabetes mellitus without complications: Secondary | ICD-10-CM | POA: Diagnosis not present

## 2020-02-11 DIAGNOSIS — E782 Mixed hyperlipidemia: Secondary | ICD-10-CM | POA: Diagnosis not present

## 2020-04-10 DIAGNOSIS — E119 Type 2 diabetes mellitus without complications: Secondary | ICD-10-CM | POA: Diagnosis not present

## 2020-04-10 DIAGNOSIS — E782 Mixed hyperlipidemia: Secondary | ICD-10-CM | POA: Diagnosis not present

## 2020-04-10 DIAGNOSIS — I10 Essential (primary) hypertension: Secondary | ICD-10-CM | POA: Diagnosis not present

## 2020-04-10 DIAGNOSIS — F330 Major depressive disorder, recurrent, mild: Secondary | ICD-10-CM | POA: Diagnosis not present

## 2020-05-10 DIAGNOSIS — E782 Mixed hyperlipidemia: Secondary | ICD-10-CM | POA: Diagnosis not present

## 2020-05-10 DIAGNOSIS — I10 Essential (primary) hypertension: Secondary | ICD-10-CM | POA: Diagnosis not present

## 2020-05-10 DIAGNOSIS — F330 Major depressive disorder, recurrent, mild: Secondary | ICD-10-CM | POA: Diagnosis not present

## 2020-05-10 DIAGNOSIS — E119 Type 2 diabetes mellitus without complications: Secondary | ICD-10-CM | POA: Diagnosis not present

## 2020-06-11 DIAGNOSIS — K219 Gastro-esophageal reflux disease without esophagitis: Secondary | ICD-10-CM | POA: Diagnosis not present

## 2020-06-11 DIAGNOSIS — I10 Essential (primary) hypertension: Secondary | ICD-10-CM | POA: Diagnosis not present

## 2020-08-04 DIAGNOSIS — I10 Essential (primary) hypertension: Secondary | ICD-10-CM | POA: Diagnosis not present

## 2020-08-04 DIAGNOSIS — E782 Mixed hyperlipidemia: Secondary | ICD-10-CM | POA: Diagnosis not present

## 2020-08-04 DIAGNOSIS — E559 Vitamin D deficiency, unspecified: Secondary | ICD-10-CM | POA: Diagnosis not present

## 2020-08-04 DIAGNOSIS — E1165 Type 2 diabetes mellitus with hyperglycemia: Secondary | ICD-10-CM | POA: Diagnosis not present

## 2020-08-09 ENCOUNTER — Ambulatory Visit (HOSPITAL_COMMUNITY)
Admission: RE | Admit: 2020-08-09 | Discharge: 2020-08-09 | Disposition: A | Discharge status: Home / Self Care | Payer: PPO | Source: Ambulatory Visit | Attending: Family Medicine | Admitting: Family Medicine

## 2020-08-09 ENCOUNTER — Other Ambulatory Visit (HOSPITAL_COMMUNITY): Payer: Self-pay | Admitting: Family Medicine

## 2020-08-09 ENCOUNTER — Other Ambulatory Visit: Payer: Self-pay

## 2020-08-09 DIAGNOSIS — K30 Functional dyspepsia: Secondary | ICD-10-CM | POA: Diagnosis not present

## 2020-08-09 DIAGNOSIS — E782 Mixed hyperlipidemia: Secondary | ICD-10-CM | POA: Diagnosis not present

## 2020-08-09 DIAGNOSIS — F17200 Nicotine dependence, unspecified, uncomplicated: Secondary | ICD-10-CM | POA: Diagnosis not present

## 2020-08-09 DIAGNOSIS — I10 Essential (primary) hypertension: Secondary | ICD-10-CM | POA: Diagnosis not present

## 2020-08-09 DIAGNOSIS — M79672 Pain in left foot: Secondary | ICD-10-CM | POA: Diagnosis not present

## 2020-08-09 DIAGNOSIS — F329 Major depressive disorder, single episode, unspecified: Secondary | ICD-10-CM | POA: Diagnosis not present

## 2020-08-09 DIAGNOSIS — G894 Chronic pain syndrome: Secondary | ICD-10-CM | POA: Diagnosis not present

## 2020-08-09 DIAGNOSIS — M542 Cervicalgia: Secondary | ICD-10-CM | POA: Diagnosis not present

## 2020-08-09 DIAGNOSIS — E559 Vitamin D deficiency, unspecified: Secondary | ICD-10-CM | POA: Diagnosis not present

## 2020-08-09 DIAGNOSIS — D72829 Elevated white blood cell count, unspecified: Secondary | ICD-10-CM | POA: Diagnosis not present

## 2020-08-09 DIAGNOSIS — M25552 Pain in left hip: Secondary | ICD-10-CM | POA: Diagnosis not present

## 2020-08-09 DIAGNOSIS — E1165 Type 2 diabetes mellitus with hyperglycemia: Secondary | ICD-10-CM | POA: Diagnosis not present

## 2020-08-10 DIAGNOSIS — E1165 Type 2 diabetes mellitus with hyperglycemia: Secondary | ICD-10-CM | POA: Diagnosis not present

## 2020-08-10 DIAGNOSIS — I10 Essential (primary) hypertension: Secondary | ICD-10-CM | POA: Diagnosis not present

## 2020-09-05 DIAGNOSIS — E1165 Type 2 diabetes mellitus with hyperglycemia: Secondary | ICD-10-CM | POA: Diagnosis not present

## 2020-09-05 DIAGNOSIS — I10 Essential (primary) hypertension: Secondary | ICD-10-CM | POA: Diagnosis not present

## 2020-10-11 DIAGNOSIS — I10 Essential (primary) hypertension: Secondary | ICD-10-CM | POA: Diagnosis not present

## 2020-10-11 DIAGNOSIS — E1165 Type 2 diabetes mellitus with hyperglycemia: Secondary | ICD-10-CM | POA: Diagnosis not present

## 2020-10-11 DIAGNOSIS — E119 Type 2 diabetes mellitus without complications: Secondary | ICD-10-CM | POA: Diagnosis not present

## 2021-02-08 DIAGNOSIS — Z0001 Encounter for general adult medical examination with abnormal findings: Secondary | ICD-10-CM | POA: Diagnosis not present

## 2021-02-08 DIAGNOSIS — E559 Vitamin D deficiency, unspecified: Secondary | ICD-10-CM | POA: Diagnosis not present

## 2021-02-08 DIAGNOSIS — E1165 Type 2 diabetes mellitus with hyperglycemia: Secondary | ICD-10-CM | POA: Diagnosis not present

## 2021-02-08 DIAGNOSIS — I10 Essential (primary) hypertension: Secondary | ICD-10-CM | POA: Diagnosis not present

## 2021-02-13 DIAGNOSIS — M25552 Pain in left hip: Secondary | ICD-10-CM | POA: Diagnosis not present

## 2021-02-13 DIAGNOSIS — E782 Mixed hyperlipidemia: Secondary | ICD-10-CM | POA: Diagnosis not present

## 2021-02-13 DIAGNOSIS — D72829 Elevated white blood cell count, unspecified: Secondary | ICD-10-CM | POA: Diagnosis not present

## 2021-02-13 DIAGNOSIS — E559 Vitamin D deficiency, unspecified: Secondary | ICD-10-CM | POA: Diagnosis not present

## 2021-02-13 DIAGNOSIS — F17200 Nicotine dependence, unspecified, uncomplicated: Secondary | ICD-10-CM | POA: Diagnosis not present

## 2021-02-13 DIAGNOSIS — E1165 Type 2 diabetes mellitus with hyperglycemia: Secondary | ICD-10-CM | POA: Diagnosis not present

## 2021-02-13 DIAGNOSIS — M542 Cervicalgia: Secondary | ICD-10-CM | POA: Diagnosis not present

## 2021-02-13 DIAGNOSIS — G894 Chronic pain syndrome: Secondary | ICD-10-CM | POA: Diagnosis not present

## 2021-02-13 DIAGNOSIS — F329 Major depressive disorder, single episode, unspecified: Secondary | ICD-10-CM | POA: Diagnosis not present

## 2021-02-13 DIAGNOSIS — I10 Essential (primary) hypertension: Secondary | ICD-10-CM | POA: Diagnosis not present

## 2021-02-13 DIAGNOSIS — K30 Functional dyspepsia: Secondary | ICD-10-CM | POA: Diagnosis not present

## 2021-03-13 DIAGNOSIS — I10 Essential (primary) hypertension: Secondary | ICD-10-CM | POA: Diagnosis not present

## 2021-03-13 DIAGNOSIS — E782 Mixed hyperlipidemia: Secondary | ICD-10-CM | POA: Diagnosis not present

## 2021-05-25 DIAGNOSIS — G894 Chronic pain syndrome: Secondary | ICD-10-CM | POA: Diagnosis not present

## 2021-05-25 DIAGNOSIS — I10 Essential (primary) hypertension: Secondary | ICD-10-CM | POA: Diagnosis not present

## 2021-05-25 DIAGNOSIS — R2240 Localized swelling, mass and lump, unspecified lower limb: Secondary | ICD-10-CM | POA: Diagnosis not present

## 2021-05-25 DIAGNOSIS — Z8616 Personal history of COVID-19: Secondary | ICD-10-CM | POA: Diagnosis not present

## 2021-06-19 DIAGNOSIS — R2240 Localized swelling, mass and lump, unspecified lower limb: Secondary | ICD-10-CM | POA: Diagnosis not present

## 2021-06-19 DIAGNOSIS — I10 Essential (primary) hypertension: Secondary | ICD-10-CM | POA: Diagnosis not present

## 2021-08-06 DIAGNOSIS — E782 Mixed hyperlipidemia: Secondary | ICD-10-CM | POA: Diagnosis not present

## 2021-08-06 DIAGNOSIS — E1165 Type 2 diabetes mellitus with hyperglycemia: Secondary | ICD-10-CM | POA: Diagnosis not present

## 2021-08-06 DIAGNOSIS — E559 Vitamin D deficiency, unspecified: Secondary | ICD-10-CM | POA: Diagnosis not present

## 2021-08-06 DIAGNOSIS — I10 Essential (primary) hypertension: Secondary | ICD-10-CM | POA: Diagnosis not present

## 2021-08-07 DIAGNOSIS — I700 Atherosclerosis of aorta: Secondary | ICD-10-CM | POA: Diagnosis not present

## 2021-08-07 DIAGNOSIS — R0602 Shortness of breath: Secondary | ICD-10-CM | POA: Diagnosis not present

## 2021-08-07 DIAGNOSIS — Z20822 Contact with and (suspected) exposure to covid-19: Secondary | ICD-10-CM | POA: Diagnosis not present

## 2021-08-07 DIAGNOSIS — R9431 Abnormal electrocardiogram [ECG] [EKG]: Secondary | ICD-10-CM | POA: Diagnosis not present

## 2021-08-07 DIAGNOSIS — J40 Bronchitis, not specified as acute or chronic: Secondary | ICD-10-CM | POA: Diagnosis not present

## 2021-08-07 DIAGNOSIS — Z720 Tobacco use: Secondary | ICD-10-CM | POA: Diagnosis not present

## 2021-08-15 ENCOUNTER — Other Ambulatory Visit (HOSPITAL_COMMUNITY): Payer: Self-pay | Admitting: Family Medicine

## 2021-08-15 ENCOUNTER — Other Ambulatory Visit: Payer: Self-pay | Admitting: Family Medicine

## 2021-08-15 DIAGNOSIS — E559 Vitamin D deficiency, unspecified: Secondary | ICD-10-CM | POA: Diagnosis not present

## 2021-08-15 DIAGNOSIS — M25552 Pain in left hip: Secondary | ICD-10-CM | POA: Diagnosis not present

## 2021-08-15 DIAGNOSIS — R0600 Dyspnea, unspecified: Secondary | ICD-10-CM | POA: Diagnosis not present

## 2021-08-15 DIAGNOSIS — I10 Essential (primary) hypertension: Secondary | ICD-10-CM | POA: Diagnosis not present

## 2021-08-15 DIAGNOSIS — M542 Cervicalgia: Secondary | ICD-10-CM | POA: Diagnosis not present

## 2021-08-15 DIAGNOSIS — R1900 Intra-abdominal and pelvic swelling, mass and lump, unspecified site: Secondary | ICD-10-CM

## 2021-08-15 DIAGNOSIS — E782 Mixed hyperlipidemia: Secondary | ICD-10-CM | POA: Diagnosis not present

## 2021-08-15 DIAGNOSIS — R2240 Localized swelling, mass and lump, unspecified lower limb: Secondary | ICD-10-CM | POA: Diagnosis not present

## 2021-08-15 DIAGNOSIS — K30 Functional dyspepsia: Secondary | ICD-10-CM | POA: Diagnosis not present

## 2021-08-15 DIAGNOSIS — G894 Chronic pain syndrome: Secondary | ICD-10-CM | POA: Diagnosis not present

## 2021-08-15 DIAGNOSIS — E1165 Type 2 diabetes mellitus with hyperglycemia: Secondary | ICD-10-CM | POA: Diagnosis not present

## 2021-08-15 DIAGNOSIS — F17200 Nicotine dependence, unspecified, uncomplicated: Secondary | ICD-10-CM | POA: Diagnosis not present

## 2021-08-15 DIAGNOSIS — F329 Major depressive disorder, single episode, unspecified: Secondary | ICD-10-CM | POA: Diagnosis not present

## 2021-08-23 ENCOUNTER — Ambulatory Visit (HOSPITAL_COMMUNITY)
Admission: RE | Admit: 2021-08-23 | Discharge: 2021-08-23 | Disposition: A | Discharge status: Home / Self Care | Payer: PPO | Source: Ambulatory Visit | Attending: Family Medicine | Admitting: Family Medicine

## 2021-08-23 DIAGNOSIS — R1900 Intra-abdominal and pelvic swelling, mass and lump, unspecified site: Secondary | ICD-10-CM | POA: Insufficient documentation

## 2021-08-23 DIAGNOSIS — M7989 Other specified soft tissue disorders: Secondary | ICD-10-CM | POA: Diagnosis not present

## 2021-09-20 DIAGNOSIS — N941 Unspecified dyspareunia: Secondary | ICD-10-CM | POA: Diagnosis not present

## 2021-09-20 DIAGNOSIS — E559 Vitamin D deficiency, unspecified: Secondary | ICD-10-CM | POA: Diagnosis not present

## 2021-09-20 DIAGNOSIS — R5383 Other fatigue: Secondary | ICD-10-CM | POA: Diagnosis not present

## 2021-11-12 ENCOUNTER — Other Ambulatory Visit (HOSPITAL_COMMUNITY)
Admission: RE | Admit: 2021-11-12 | Discharge: 2021-11-12 | Disposition: A | Discharge status: Home / Self Care | Payer: PPO | Source: Ambulatory Visit | Attending: Adult Health | Admitting: Adult Health

## 2021-11-12 ENCOUNTER — Ambulatory Visit (INDEPENDENT_AMBULATORY_CARE_PROVIDER_SITE_OTHER): Payer: PPO | Admitting: Adult Health

## 2021-11-12 ENCOUNTER — Encounter: Payer: Self-pay | Admitting: Adult Health

## 2021-11-12 VITALS — BP 147/87 | HR 99 | Ht 61.0 in | Wt 156.5 lb

## 2021-11-12 DIAGNOSIS — Z124 Encounter for screening for malignant neoplasm of cervix: Secondary | ICD-10-CM | POA: Diagnosis not present

## 2021-11-12 DIAGNOSIS — Z01419 Encounter for gynecological examination (general) (routine) without abnormal findings: Secondary | ICD-10-CM | POA: Diagnosis not present

## 2021-11-12 DIAGNOSIS — Z1151 Encounter for screening for human papillomavirus (HPV): Secondary | ICD-10-CM | POA: Insufficient documentation

## 2021-11-12 DIAGNOSIS — N941 Unspecified dyspareunia: Secondary | ICD-10-CM | POA: Diagnosis not present

## 2021-11-12 DIAGNOSIS — N952 Postmenopausal atrophic vaginitis: Secondary | ICD-10-CM | POA: Diagnosis not present

## 2021-11-12 MED ORDER — PREMARIN 0.625 MG/GM VA CREA: TOPICAL_CREAM | VAGINAL | 1 refills | Status: DC

## 2021-11-12 NOTE — Progress Notes (Signed)
Subjective:     Patient ID: Kara Garcia, female   DOB: [birth year], 66 y.o.   MRN: [id number]  HPI Kara Garcia is a 66 year old white female, married, PM in for complaints of pain and burning with sex, did bleed once. She is not sure of last pap.   PCP is Governor Rooks NP.   Review of Systems Has pain and burning with sex Did bleed once Reviewed past medical,surgical, social and family history. Reviewed medications and allergies.     Objective:   Physical Exam BP (!) 147/87 (BP Location: Left Arm, Patient Position: Sitting, Cuff Size: Normal)   Pulse 99   Ht 5\' 1"  (1.549 m)   Wt 156 lb 8 oz (71 kg)   BMI 29.57 kg/m     Skin warm and dry.Pelvic: external genitalia is normal in appearance no lesions, vagina: pale, with loss of moisture and rugae, atrophic,urethra has no lesions or masses noted, cervix:smooth,pap with GC/CHL and HR HPV genotyping performed, uterus: normal size, shape and contour, non tender, no masses felt, adnexa: no masses or tenderness noted. Bladder is non tender and no masses felt.  She said it burned after exam.  AA is 0 Fall risk is low    11/12/2021   11:07 AM  Depression screen PHQ 2/9  Decreased Interest 0  Down, Depressed, Hopeless 0  PHQ - 2 Score 0  Altered sleeping 0  Tired, decreased energy 1  Change in appetite 0  Feeling bad or failure about yourself  0  Trouble concentrating 0  Moving slowly or fidgety/restless 0  Suicidal thoughts 0  PHQ-9 Score 1       11/12/2021   11:07 AM  GAD 7 : Generalized Anxiety Score  Nervous, Anxious, on Edge 0  Control/stop worrying 0  Worry too much - different things 0  Trouble relaxing 0  Restless 0  Easily annoyed or irritable 0  Afraid - awful might happen 0  Total GAD 7 Score 0      Upstream - 11/12/21 1135       Pregnancy Intention Screening   Does the patient want to become pregnant in the next year? No    Does the patient's partner want to become pregnant in the next year? No    Would the patient  like to discuss contraceptive options today? No      Contraception Wrap Up   Current Method No Method - Other Reason   PM   End Method No Method - Other Reason   PM   Contraception Counseling Provided No            Examination chaperoned by Levy Pupa LPN  Assessment:     1. Dyspareunia in female Has pain and burning with sex Did bleed 1 time Will try PVC, no sex for 2 weeks then use good lubricate  2. Vaginal atrophy Will try PVC, gave 2 sample tubes and Rx sent  Meds ordered this encounter  Medications   conjugated estrogens (PREMARIN) vaginal cream    Sig: Use 0.5 gm in vagina daily for 2 weeks then 2 x weekly    Dispense:  42.5 g    Refill:  1    Order Specific Question:   Supervising Provider    Answer:   EURE, LUTHER H [[id number]]     3. Routine Papanicolaou smear Pap sent     Plan:    Try to decrease and stop smoking  Follow up in 6 weeks

## 2021-11-13 LAB — CYTOLOGY - PAP
Adequacy: ABSENT
Chlamydia: NEGATIVE
Comment: NEGATIVE
Comment: NEGATIVE
Comment: NORMAL
Diagnosis: NEGATIVE
High risk HPV: NEGATIVE
Neisseria Gonorrhea: NEGATIVE

## 2021-11-16 ENCOUNTER — Emergency Department (HOSPITAL_COMMUNITY): Payer: PPO

## 2021-11-16 ENCOUNTER — Other Ambulatory Visit: Payer: Self-pay

## 2021-11-16 ENCOUNTER — Encounter (HOSPITAL_COMMUNITY): Payer: Self-pay | Admitting: *Deleted

## 2021-11-16 ENCOUNTER — Emergency Department (HOSPITAL_COMMUNITY)
Admission: EM | Admit: 2021-11-16 | Discharge: 2021-11-16 | Disposition: A | Discharge status: Home / Self Care | Payer: PPO | Attending: Emergency Medicine | Admitting: Emergency Medicine

## 2021-11-16 DIAGNOSIS — M25562 Pain in left knee: Secondary | ICD-10-CM | POA: Insufficient documentation

## 2021-11-16 DIAGNOSIS — W010XXA Fall on same level from slipping, tripping and stumbling without subsequent striking against object, initial encounter: Secondary | ICD-10-CM | POA: Insufficient documentation

## 2021-11-16 DIAGNOSIS — Z79899 Other long term (current) drug therapy: Secondary | ICD-10-CM | POA: Diagnosis not present

## 2021-11-16 MED ORDER — IBUPROFEN 400 MG PO TABS
600.0000 mg | ORAL_TABLET | Freq: Once | ORAL | Status: AC
  Administered 2021-11-16: 600 mg via ORAL
  Filled 2021-11-16: qty 2

## 2021-11-16 NOTE — Discharge Instructions (Addendum)
Note your x-ray today was negative for fracture or dislocation.  We will treat your right knee pain with a knee sleeve as well as rest, ice, elevation and NSAIDs such as ibuprofen.  Recommend close follow-up with orthopedics in 5 to 7 days for reevaluation.  Please do not hesitate to return to emergency department for worrisome signs symptoms we discussed become apparent.

## 2021-11-16 NOTE — ED Triage Notes (Signed)
Pt states she fell in the shower yesterday after slipping and now with left knee pain. Painful with weight bearing.  Denies hitting her head. Denies blood thinners.

## 2021-11-16 NOTE — ED Provider Notes (Signed)
[facility] EMERGENCY DEPARTMENT Provider Note   CSN: [id number] Arrival date & time: 11/16/21  1127     History  Chief Complaint  Patient presents with   Kara Garcia is a 66 y.o. female.   Fall   66 year old female presents emergency department with complaints of left knee pain after a fall.  Patient states that fall occurred last night when she slipped when she was in the bath tub.  She denies trauma to head, loss of consciousness or other musculoskeletal pain from baseline.  She landed directly on her left knee but injured no other parts of her body per patient.  She is able to ambulate after the fall but presented to the emergency department today due to increased swelling as well as increased pain with ambulation.  Denies feelings of instability, weakness/sensory deficits in left leg.  Denies fever, chills, night sweats, chest pain, shortness of breath, Donnell pain, nausea, vomiting.  Past medical history significant for hypertension, borderline diabetes  Home Medications Prior to Admission medications   Medication Sig Start Date End Date Taking? Authorizing Provider  albuterol (VENTOLIN HFA) 108 (90 Base) MCG/ACT inhaler Inhale into the lungs. 08/07/21   [provider]  chlorthalidone (HYGROTON) 25 MG tablet Take 25 mg by mouth daily.    [provider]  conjugated estrogens (PREMARIN) vaginal cream Use 0.5 gm in vagina daily for 2 weeks then 2 x weekly 11/12/21   Cyril Mourning A, NP  glipiZIDE (GLUCOTROL) 5 MG tablet Take by mouth daily before breakfast.    [provider]  losartan (COZAAR) 100 MG tablet Take 100 mg by mouth daily.      [provider]  omeprazole (PRILOSEC) 40 MG capsule Take 40 mg by mouth daily.    [provider]  traMADol (ULTRAM) 50 MG tablet Take by mouth every 6 (six) hours as needed.    [provider]      Allergies    Metformin and related    Review of Systems   Review of  Systems  All other systems reviewed and are negative.   Physical Exam Updated Vital Signs BP (!) 147/75   Pulse 97   Temp 98.6 F (37 C) (Oral)   Resp 16   Ht 5\' 1"  (1.549 m)   Wt 70.8 kg   SpO2 95%   BMI 29.48 kg/m  Physical Exam Vitals and nursing note reviewed.  Constitutional:      General: She is not in acute distress.    Appearance: She is well-developed.  HENT:     Head: Normocephalic and atraumatic.  Eyes:     Conjunctiva/sclera: Conjunctivae normal.  Cardiovascular:     Rate and Rhythm: Normal rate and regular rhythm.     Heart sounds: No murmur heard. Pulmonary:     Effort: Pulmonary effort is normal. No respiratory distress.     Breath sounds: Normal breath sounds.  Abdominal:     Palpations: Abdomen is soft.     Tenderness: There is no abdominal tenderness. There is no guarding.  Musculoskeletal:        General: No swelling.     Cervical back: Neck supple.     Comments: No midline cervical, thoracic, lumbar spine tenderness with no obvious step-off or deformity noted.  No tenderness palpation of anterior posterior chest wall.  No tenderness palpation along upper or lower extremities excluding left knee.  Patient has full active range of motion of left knee but  with associated pain.  Dorsalis pedis pulses full intact bilaterally.  Muscle strength 5 out of 5 for lower extremities.  Tenderness palpation of lateral aspect of left knee extending up to left quadricep muscles.  Joint effusion noted externally as well as in the popliteal fossa.  No overlying erythematic skin changes noted.  No obvious joint laxity noted.  Skin:    General: Skin is warm and dry.     Capillary Refill: Capillary refill takes less than 2 seconds.  Neurological:     Mental Status: She is alert.  Psychiatric:        Mood and Affect: Mood normal.     ED Results / Procedures / Treatments   Labs (all labs ordered are listed, but only abnormal results are displayed) Labs Reviewed - No  data to display  EKG None  Radiology DG Knee Complete 4 Views Left  Result Date: 11/16/2021 CLINICAL DATA:  Fall, left knee pain EXAM: LEFT KNEE - COMPLETE 4+ VIEW COMPARISON:  None Available. FINDINGS: No evidence of fracture or dislocation. Moderate-sized knee joint effusion. No visible fat-fluid level. Mild-to-moderate medial compartment joint space narrowing with marginal osteophyte formation. Soft tissues are unremarkable. IMPRESSION: 1. No evidence of fracture or dislocation. 2. Moderate-sized knee joint effusion. 3. Mild-to-moderate medial compartment osteoarthritis. Electronically Signed   By: Davina Poke D.O.   On: 11/16/2021 12:51    Procedures Procedures    Medications Ordered in ED Medications  ibuprofen (ADVIL) tablet 600 mg (has no administration in time range)    ED Course/ Medical Decision Making/ A&P                           Medical Decision Making Amount and/or Complexity of Data Reviewed Radiology: ordered.  Risk Prescription drug management.   This patient presents to the ED for concern of left knee pain, this involves an extensive number of treatment options, and is a complaint that carries with it a high risk of complications and morbidity.  The differential diagnosis includes fracture, strain/sprain, dislocation, neurovascular compromise   Co morbidities that complicate the patient evaluation  See HPI   Additional history obtained:  Additional history obtained from EMR External records from outside source obtained and reviewed including hospital records   Lab Tests:  N/a   Imaging Studies ordered:  I ordered imaging studies including left knee x-ray I independently visualized and interpreted imaging which showed no evidence of fracture or dislocation.  Moderate sized knee joint effusion.  Mild to moderate medial compartment osteoarthritis. I agree with the radiologist interpretation  Cardiac Monitoring: / EKG:  The patient was  maintained on a cardiac monitor.  I personally viewed and interpreted the cardiac monitored which showed an underlying rhythm of: Sinus rhythm   Consultations Obtained:  N/a   Problem List / ED Course / Critical interventions / Medication management  Left knee pain I ordered medication including ibuprofen for pain   Reevaluation of the patient after these medicines showed that the patient improved I have reviewed the patients home medicines and have made adjustments as needed   Social Determinants of Health:  Chronic cigarette use.  Denies illicit drug use.   Test / Admission - Considered:  Left knee pain Vitals signs significant for mild hypertension with a blood pressure 147/75.  Recommend close follow-up PCP regarding elevation of blood pressure.. Otherwise within normal range and stable throughout visit. Imaging studies significant for: See above No acute fracture, dislocation or  joint laxity appreciated on exam/imaging studies.  Patient to be treated with supportive compression knee sleeve with at home symptomatic therapy recommended with rest, ice, elevation and NSAID therapy.  Close follow-up with orthopedics recommended in 5 to 7 days for reevaluation.  Treatment plan discussed with patient and she acknowledged understanding agreeable to said plan. Worrisome signs and symptoms were discussed with the patient, and the patient acknowledged understanding to return to the ED if noticed. Patient was stable upon discharge.          Final Clinical Impression(s) / ED Diagnoses Final diagnoses:  Acute pain of left knee    Rx / DC Orders ED Discharge Orders     None         Peter Garter, [place] 11/16/21 1312    Terrilee Files, MD 11/16/21 [phone]

## 2021-11-22 DIAGNOSIS — M25562 Pain in left knee: Secondary | ICD-10-CM | POA: Diagnosis not present

## 2021-11-28 ENCOUNTER — Telehealth: Payer: Self-pay

## 2021-11-28 NOTE — Telephone Encounter (Signed)
        Patient  visited 11/16/2021 on [facility]  for Pain in left knee.   Telephone encounter attempt :  1st  A HIPAA compliant voice message was left requesting a return call.  Instructed patient to call back at [phone].   [place] Resource Care Guide   [email or web address]  ?? [phone]   Website: [email or web address]  [email or web address]

## 2021-11-29 ENCOUNTER — Telehealth: Payer: Self-pay

## 2021-11-29 NOTE — Telephone Encounter (Signed)
     Patient  visit on 11/16/2021  at [facility] was for pain in left knee.  Have you been able to follow up with your primary care physician? Yes  The patient was or was not able to obtain any needed medicine or equipment.Patient obtained medications.  Are there diet recommendations that you are having difficulty following? No  Patient expresses understanding of discharge instructions and education provided has no other needs at this time.    [place] Resource Care Guide   [email or web address]  ?? [phone]   Website: [email or web address]  [email or web address]

## 2021-12-24 ENCOUNTER — Encounter: Payer: Self-pay | Admitting: Adult Health

## 2021-12-24 ENCOUNTER — Ambulatory Visit (INDEPENDENT_AMBULATORY_CARE_PROVIDER_SITE_OTHER): Payer: PPO | Admitting: Adult Health

## 2021-12-24 VITALS — BP 129/73 | HR 102 | Ht 61.0 in | Wt 158.0 lb

## 2021-12-24 DIAGNOSIS — N941 Unspecified dyspareunia: Secondary | ICD-10-CM | POA: Diagnosis not present

## 2021-12-24 DIAGNOSIS — N952 Postmenopausal atrophic vaginitis: Secondary | ICD-10-CM | POA: Diagnosis not present

## 2021-12-24 NOTE — Progress Notes (Signed)
  Subjective:     Patient ID: Kara Garcia, female   DOB: 12/11/1955, 66 y.o.   MRN: [id number]  HPI Kara Garcia is a 66 year old white female, married, PM back in follow up on using Premarin vaginal cream and has no pain or burning with sex now.  Last pap was negative HPV and malignancy 11/12/21.  PCP is Leone Payor NP  Review of Systems No pain or burning now, with sex  Reviewed past medical,surgical, social and family history. Reviewed medications and allergies.     Objective:   Physical Exam BP 129/73 (BP Location: Right Arm, Patient Position: Sitting, Cuff Size: Normal)   Pulse (!) 102   Ht 5\' 1"  (1.549 m)   Wt 158 lb (71.7 kg)   BMI 29.85 kg/m     Skin warm and dry.Pelvic: external genitalia is normal in appearance no lesions, vagina: pinker and more moist,urethra has no lesions or masses noted, cervix:smoothuterus: normal size, shape and contour, non tender, no masses felt, adnexa: no masses or tenderness noted. Bladder is non tender and no masses felt.  Fall risk is low    12/24/2021    9:28 AM 11/12/2021   11:07 AM  Depression screen PHQ 2/9  Decreased Interest 3 0  Down, Depressed, Hopeless 1 0  PHQ - 2 Score 4 0  Altered sleeping 0 0  Tired, decreased energy 0 1  Change in appetite 0 0  Feeling bad or failure about yourself  0 0  Trouble concentrating 0 0  Moving slowly or fidgety/restless 0 0  Suicidal thoughts 0 0  PHQ-9 Score 4 1  Difficult doing work/chores Not difficult at all      Upstream - 12/24/21 12/26/21       Pregnancy Intention Screening   Does the patient want to become pregnant in the next year? N/A    Does the patient's partner want to become pregnant in the next year? N/A    Would the patient like to discuss contraceptive options today? N/A      Contraception Wrap Up   Current Method No Method - Other Reason   post menopausal   End Method No Method - Other Reason   post menopausal   Contraception Counseling Provided No             Examination chaperoned by [id number] RN  Assessment:     1. Vaginal atrophy She is using PVC and tissues pinker and more moist Continue using Premarin vaginal cream 2-3 x weeks, has refills   2. Dyspareunia in female,resolved No pain with  sex now    Plan:      Follow up prn

## 2022-02-13 DIAGNOSIS — E559 Vitamin D deficiency, unspecified: Secondary | ICD-10-CM | POA: Diagnosis not present

## 2022-02-13 DIAGNOSIS — E782 Mixed hyperlipidemia: Secondary | ICD-10-CM | POA: Diagnosis not present

## 2022-02-13 DIAGNOSIS — I10 Essential (primary) hypertension: Secondary | ICD-10-CM | POA: Diagnosis not present

## 2022-02-13 DIAGNOSIS — E1165 Type 2 diabetes mellitus with hyperglycemia: Secondary | ICD-10-CM | POA: Diagnosis not present

## 2022-02-18 DIAGNOSIS — R2240 Localized swelling, mass and lump, unspecified lower limb: Secondary | ICD-10-CM | POA: Diagnosis not present

## 2022-02-18 DIAGNOSIS — F17210 Nicotine dependence, cigarettes, uncomplicated: Secondary | ICD-10-CM | POA: Diagnosis not present

## 2022-02-18 DIAGNOSIS — M542 Cervicalgia: Secondary | ICD-10-CM | POA: Diagnosis not present

## 2022-02-18 DIAGNOSIS — E782 Mixed hyperlipidemia: Secondary | ICD-10-CM | POA: Diagnosis not present

## 2022-02-18 DIAGNOSIS — E559 Vitamin D deficiency, unspecified: Secondary | ICD-10-CM | POA: Diagnosis not present

## 2022-02-18 DIAGNOSIS — K30 Functional dyspepsia: Secondary | ICD-10-CM | POA: Diagnosis not present

## 2022-02-18 DIAGNOSIS — M609 Myositis, unspecified: Secondary | ICD-10-CM | POA: Diagnosis not present

## 2022-02-18 DIAGNOSIS — I10 Essential (primary) hypertension: Secondary | ICD-10-CM | POA: Diagnosis not present

## 2022-02-18 DIAGNOSIS — E1165 Type 2 diabetes mellitus with hyperglycemia: Secondary | ICD-10-CM | POA: Diagnosis not present

## 2022-02-18 DIAGNOSIS — Z0000 Encounter for general adult medical examination without abnormal findings: Secondary | ICD-10-CM | POA: Diagnosis not present

## 2022-02-18 DIAGNOSIS — F329 Major depressive disorder, single episode, unspecified: Secondary | ICD-10-CM | POA: Diagnosis not present

## 2022-02-18 DIAGNOSIS — M25552 Pain in left hip: Secondary | ICD-10-CM | POA: Diagnosis not present

## 2022-04-04 DIAGNOSIS — H25812 Combined forms of age-related cataract, left eye: Secondary | ICD-10-CM | POA: Diagnosis not present

## 2022-04-26 DIAGNOSIS — H269 Unspecified cataract: Secondary | ICD-10-CM | POA: Diagnosis not present

## 2022-04-26 DIAGNOSIS — H25812 Combined forms of age-related cataract, left eye: Secondary | ICD-10-CM | POA: Diagnosis not present

## 2022-05-17 DIAGNOSIS — H269 Unspecified cataract: Secondary | ICD-10-CM | POA: Diagnosis not present

## 2022-05-17 DIAGNOSIS — H2511 Age-related nuclear cataract, right eye: Secondary | ICD-10-CM | POA: Diagnosis not present

## 2022-05-24 DIAGNOSIS — H04123 Dry eye syndrome of bilateral lacrimal glands: Secondary | ICD-10-CM | POA: Diagnosis not present

## 2022-08-14 DIAGNOSIS — E782 Mixed hyperlipidemia: Secondary | ICD-10-CM | POA: Diagnosis not present

## 2022-08-14 DIAGNOSIS — E1165 Type 2 diabetes mellitus with hyperglycemia: Secondary | ICD-10-CM | POA: Diagnosis not present

## 2022-08-14 DIAGNOSIS — E559 Vitamin D deficiency, unspecified: Secondary | ICD-10-CM | POA: Diagnosis not present

## 2022-08-14 DIAGNOSIS — I10 Essential (primary) hypertension: Secondary | ICD-10-CM | POA: Diagnosis not present

## 2022-08-22 DIAGNOSIS — Z0001 Encounter for general adult medical examination with abnormal findings: Secondary | ICD-10-CM | POA: Diagnosis not present

## 2022-08-22 DIAGNOSIS — Z0000 Encounter for general adult medical examination without abnormal findings: Secondary | ICD-10-CM | POA: Diagnosis not present

## 2022-08-22 DIAGNOSIS — F329 Major depressive disorder, single episode, unspecified: Secondary | ICD-10-CM | POA: Diagnosis not present

## 2022-08-22 DIAGNOSIS — E559 Vitamin D deficiency, unspecified: Secondary | ICD-10-CM | POA: Diagnosis not present

## 2022-08-22 DIAGNOSIS — M542 Cervicalgia: Secondary | ICD-10-CM | POA: Diagnosis not present

## 2022-08-22 DIAGNOSIS — F17200 Nicotine dependence, unspecified, uncomplicated: Secondary | ICD-10-CM | POA: Diagnosis not present

## 2022-08-22 DIAGNOSIS — I10 Essential (primary) hypertension: Secondary | ICD-10-CM | POA: Diagnosis not present

## 2022-08-22 DIAGNOSIS — M609 Myositis, unspecified: Secondary | ICD-10-CM | POA: Diagnosis not present

## 2022-08-22 DIAGNOSIS — R2240 Localized swelling, mass and lump, unspecified lower limb: Secondary | ICD-10-CM | POA: Diagnosis not present

## 2022-08-22 DIAGNOSIS — E782 Mixed hyperlipidemia: Secondary | ICD-10-CM | POA: Diagnosis not present

## 2022-08-22 DIAGNOSIS — K30 Functional dyspepsia: Secondary | ICD-10-CM | POA: Diagnosis not present

## 2022-08-22 DIAGNOSIS — E1165 Type 2 diabetes mellitus with hyperglycemia: Secondary | ICD-10-CM | POA: Diagnosis not present

## 2022-09-13 DIAGNOSIS — Z79899 Other long term (current) drug therapy: Secondary | ICD-10-CM | POA: Diagnosis not present

## 2022-09-13 DIAGNOSIS — F17210 Nicotine dependence, cigarettes, uncomplicated: Secondary | ICD-10-CM | POA: Diagnosis not present

## 2022-09-13 DIAGNOSIS — I10 Essential (primary) hypertension: Secondary | ICD-10-CM | POA: Diagnosis not present

## 2022-09-13 DIAGNOSIS — Z713 Dietary counseling and surveillance: Secondary | ICD-10-CM | POA: Diagnosis not present

## 2022-11-13 DIAGNOSIS — M7581 Other shoulder lesions, right shoulder: Secondary | ICD-10-CM | POA: Diagnosis not present

## 2022-11-13 DIAGNOSIS — M67819 Other specified disorders of synovium and tendon, unspecified shoulder: Secondary | ICD-10-CM | POA: Diagnosis not present

## 2023-02-26 DIAGNOSIS — I10 Essential (primary) hypertension: Secondary | ICD-10-CM | POA: Diagnosis not present

## 2023-02-26 DIAGNOSIS — E559 Vitamin D deficiency, unspecified: Secondary | ICD-10-CM | POA: Diagnosis not present

## 2023-02-26 DIAGNOSIS — E1165 Type 2 diabetes mellitus with hyperglycemia: Secondary | ICD-10-CM | POA: Diagnosis not present

## 2023-03-04 DIAGNOSIS — F329 Major depressive disorder, single episode, unspecified: Secondary | ICD-10-CM | POA: Diagnosis not present

## 2023-03-04 DIAGNOSIS — R2240 Localized swelling, mass and lump, unspecified lower limb: Secondary | ICD-10-CM | POA: Diagnosis not present

## 2023-03-04 DIAGNOSIS — F17200 Nicotine dependence, unspecified, uncomplicated: Secondary | ICD-10-CM | POA: Diagnosis not present

## 2023-03-04 DIAGNOSIS — D72829 Elevated white blood cell count, unspecified: Secondary | ICD-10-CM | POA: Diagnosis not present

## 2023-03-04 DIAGNOSIS — E782 Mixed hyperlipidemia: Secondary | ICD-10-CM | POA: Diagnosis not present

## 2023-03-04 DIAGNOSIS — R319 Hematuria, unspecified: Secondary | ICD-10-CM | POA: Diagnosis not present

## 2023-03-04 DIAGNOSIS — I10 Essential (primary) hypertension: Secondary | ICD-10-CM | POA: Diagnosis not present

## 2023-03-04 DIAGNOSIS — M542 Cervicalgia: Secondary | ICD-10-CM | POA: Diagnosis not present

## 2023-03-04 DIAGNOSIS — E1165 Type 2 diabetes mellitus with hyperglycemia: Secondary | ICD-10-CM | POA: Diagnosis not present

## 2023-03-04 DIAGNOSIS — G720 Drug-induced myopathy: Secondary | ICD-10-CM | POA: Diagnosis not present

## 2023-03-04 DIAGNOSIS — G894 Chronic pain syndrome: Secondary | ICD-10-CM | POA: Diagnosis not present

## 2023-03-04 DIAGNOSIS — Z0001 Encounter for general adult medical examination with abnormal findings: Secondary | ICD-10-CM | POA: Diagnosis not present

## 2023-05-29 DIAGNOSIS — F17210 Nicotine dependence, cigarettes, uncomplicated: Secondary | ICD-10-CM | POA: Diagnosis not present

## 2023-05-29 DIAGNOSIS — G894 Chronic pain syndrome: Secondary | ICD-10-CM | POA: Diagnosis not present

## 2023-05-29 DIAGNOSIS — Z7984 Long term (current) use of oral hypoglycemic drugs: Secondary | ICD-10-CM | POA: Diagnosis not present

## 2023-05-29 DIAGNOSIS — R635 Abnormal weight gain: Secondary | ICD-10-CM | POA: Diagnosis not present

## 2023-05-29 DIAGNOSIS — E1165 Type 2 diabetes mellitus with hyperglycemia: Secondary | ICD-10-CM | POA: Diagnosis not present

## 2023-09-02 DIAGNOSIS — E1165 Type 2 diabetes mellitus with hyperglycemia: Secondary | ICD-10-CM | POA: Diagnosis not present

## 2023-09-08 DIAGNOSIS — E782 Mixed hyperlipidemia: Secondary | ICD-10-CM | POA: Diagnosis not present

## 2023-09-08 DIAGNOSIS — E559 Vitamin D deficiency, unspecified: Secondary | ICD-10-CM | POA: Diagnosis not present

## 2023-09-08 DIAGNOSIS — D72829 Elevated white blood cell count, unspecified: Secondary | ICD-10-CM | POA: Diagnosis not present

## 2023-09-08 DIAGNOSIS — K30 Functional dyspepsia: Secondary | ICD-10-CM | POA: Diagnosis not present

## 2023-09-08 DIAGNOSIS — E1165 Type 2 diabetes mellitus with hyperglycemia: Secondary | ICD-10-CM | POA: Diagnosis not present

## 2023-09-08 DIAGNOSIS — M542 Cervicalgia: Secondary | ICD-10-CM | POA: Diagnosis not present

## 2023-09-08 DIAGNOSIS — G894 Chronic pain syndrome: Secondary | ICD-10-CM | POA: Diagnosis not present

## 2023-09-08 DIAGNOSIS — F329 Major depressive disorder, single episode, unspecified: Secondary | ICD-10-CM | POA: Diagnosis not present

## 2023-09-08 DIAGNOSIS — R319 Hematuria, unspecified: Secondary | ICD-10-CM | POA: Diagnosis not present

## 2023-09-08 DIAGNOSIS — R2240 Localized swelling, mass and lump, unspecified lower limb: Secondary | ICD-10-CM | POA: Diagnosis not present

## 2023-09-08 DIAGNOSIS — G720 Drug-induced myopathy: Secondary | ICD-10-CM | POA: Diagnosis not present

## 2023-09-08 DIAGNOSIS — I10 Essential (primary) hypertension: Secondary | ICD-10-CM | POA: Diagnosis not present

## 2023-09-09 ENCOUNTER — Encounter (INDEPENDENT_AMBULATORY_CARE_PROVIDER_SITE_OTHER): Payer: Self-pay | Admitting: *Deleted

## 2023-09-23 ENCOUNTER — Telehealth: Payer: Self-pay

## 2023-09-23 NOTE — Telephone Encounter (Signed)
 Who is your primary care physician: Rosaline Macadam  Reasons for the colonoscopy: screening  Have you had a colonoscopy before?  Yes 11-17-2012 Dr.Jenkins  Do you have family history of colon cancer? no  Previous colonoscopy with polyps removed? no  Do you have a history colorectal cancer?   no  Are you diabetic? If yes, Type 1 or Type 2?    Yes type 2  Do you have a prosthetic or mechanical heart valve? no  Do you have a pacemaker/defibrillator?   no  Have you had endocarditis/atrial fibrillation? no  Have you had joint replacement within the last 12 months?  no  Do you tend to be constipated or have to use laxatives? no  Do you have any history of drugs or alchohol?  no  Do you use supplemental oxygen ?  no  Have you had a stroke or heart attack within the last 6 months? no  Do you take weight loss medication?  no  For female patients: have you had a hysterectomy?  no                                     are you post menopausal?       no                                            do you still have your menstrual cycle? no      Do you take any blood-thinning medications such as: (aspirin, warfarin, Plavix, Aggrenox)  no  If yes we need the name, milligram, dosage and who is prescribing doctor  Current Outpatient Medications on File Prior to Visit  Medication Sig Dispense Refill   acetaminophen (TYLENOL) 650 MG CR tablet Take 650 mg by mouth every 8 (eight) hours as needed for pain.     amLODipine (NORVASC) 2.5 MG tablet Take 2.5 mg by mouth daily.     chlorthalidone (HYGROTON) 25 MG tablet Take 25 mg by mouth daily.     glipiZIDE (GLUCOTROL) 5 MG tablet Take by mouth daily before breakfast.     losartan (COZAAR) 100 MG tablet Take 100 mg by mouth daily.       omeprazole (PRILOSEC) 40 MG capsule Take 40 mg by mouth daily.     rosuvastatin (CRESTOR) 5 MG tablet Take 5 mg by mouth daily.     RYBELSUS 3 MG TABS Take 1 tablet by mouth daily.     traMADol (ULTRAM) 50 MG tablet  Take by mouth every 6 (six) hours as needed.     No current facility-administered medications on file prior to visit.    Allergies  Allergen Reactions   Metformin And Related Other (See Comments)    BP went up     Pharmacy: [facility] Name: [facility] [phone]  Best number where you can be reached: [phone]

## 2023-09-29 DIAGNOSIS — E782 Mixed hyperlipidemia: Secondary | ICD-10-CM | POA: Diagnosis not present

## 2023-09-29 DIAGNOSIS — G720 Drug-induced myopathy: Secondary | ICD-10-CM | POA: Diagnosis not present

## 2023-09-29 DIAGNOSIS — D72829 Elevated white blood cell count, unspecified: Secondary | ICD-10-CM | POA: Diagnosis not present

## 2023-09-29 DIAGNOSIS — F17200 Nicotine dependence, unspecified, uncomplicated: Secondary | ICD-10-CM | POA: Diagnosis not present

## 2023-09-29 DIAGNOSIS — F17210 Nicotine dependence, cigarettes, uncomplicated: Secondary | ICD-10-CM | POA: Diagnosis not present

## 2023-09-29 DIAGNOSIS — R635 Abnormal weight gain: Secondary | ICD-10-CM | POA: Diagnosis not present

## 2023-09-29 DIAGNOSIS — I10 Essential (primary) hypertension: Secondary | ICD-10-CM | POA: Diagnosis not present

## 2023-09-29 DIAGNOSIS — R2240 Localized swelling, mass and lump, unspecified lower limb: Secondary | ICD-10-CM | POA: Diagnosis not present

## 2023-09-29 DIAGNOSIS — Z79899 Other long term (current) drug therapy: Secondary | ICD-10-CM | POA: Diagnosis not present

## 2023-10-20 DIAGNOSIS — M5441 Lumbago with sciatica, right side: Secondary | ICD-10-CM | POA: Diagnosis not present

## 2023-10-20 DIAGNOSIS — Z713 Dietary counseling and surveillance: Secondary | ICD-10-CM | POA: Diagnosis not present

## 2023-10-20 DIAGNOSIS — R2240 Localized swelling, mass and lump, unspecified lower limb: Secondary | ICD-10-CM | POA: Diagnosis not present

## 2023-10-20 DIAGNOSIS — Z6830 Body mass index (BMI) 30.0-30.9, adult: Secondary | ICD-10-CM | POA: Diagnosis not present

## 2023-10-20 DIAGNOSIS — Z7182 Exercise counseling: Secondary | ICD-10-CM | POA: Diagnosis not present

## 2023-10-20 DIAGNOSIS — I10 Essential (primary) hypertension: Secondary | ICD-10-CM | POA: Diagnosis not present

## 2023-10-20 DIAGNOSIS — F17210 Nicotine dependence, cigarettes, uncomplicated: Secondary | ICD-10-CM | POA: Diagnosis not present

## 2023-10-20 DIAGNOSIS — G8929 Other chronic pain: Secondary | ICD-10-CM | POA: Diagnosis not present

## 2023-10-20 DIAGNOSIS — M5442 Lumbago with sciatica, left side: Secondary | ICD-10-CM | POA: Diagnosis not present

## 2023-10-20 DIAGNOSIS — G720 Drug-induced myopathy: Secondary | ICD-10-CM | POA: Diagnosis not present

## 2023-10-20 DIAGNOSIS — E782 Mixed hyperlipidemia: Secondary | ICD-10-CM | POA: Diagnosis not present

## 2023-10-20 DIAGNOSIS — M7989 Other specified soft tissue disorders: Secondary | ICD-10-CM | POA: Diagnosis not present

## 2023-10-22 ENCOUNTER — Other Ambulatory Visit (HOSPITAL_COMMUNITY): Payer: Self-pay | Admitting: Nurse Practitioner

## 2023-10-22 DIAGNOSIS — M5432 Sciatica, left side: Secondary | ICD-10-CM

## 2023-10-22 DIAGNOSIS — M5431 Sciatica, right side: Secondary | ICD-10-CM

## 2023-10-22 DIAGNOSIS — G8929 Other chronic pain: Secondary | ICD-10-CM

## 2023-10-22 NOTE — Telephone Encounter (Signed)
 ASA 2   Needs to hold glipizide morning of procedure and hold Rybelsus for 24 hours prior.

## 2023-10-23 ENCOUNTER — Encounter (INDEPENDENT_AMBULATORY_CARE_PROVIDER_SITE_OTHER): Payer: Self-pay | Admitting: *Deleted

## 2023-10-23 MED ORDER — PEG 3350-KCL-NA BICARB-NACL 420 G PO SOLR: 4000.0000 mL | Freq: Once | ORAL | 0 refills | Status: AC

## 2023-10-23 NOTE — Telephone Encounter (Signed)
 Referral completed, [facility] apt letter sent to PCP

## 2023-10-23 NOTE — Addendum Note (Signed)
 Addended by: JEANELL GRAEME RAMAN on: 10/23/2023 10:52 AM   Modules accepted: Orders

## 2023-10-23 NOTE — Telephone Encounter (Signed)
 Spoke with pt and scheduled with Dr. Cindie 10/7. Aware will mail instructions to her and send rx for prep to pharmacy now.

## 2023-10-30 ENCOUNTER — Ambulatory Visit (HOSPITAL_COMMUNITY)
Admission: RE | Admit: 2023-10-30 | Discharge: 2023-10-30 | Disposition: A | Discharge status: Home / Self Care | Source: Ambulatory Visit | Attending: Nurse Practitioner | Admitting: Nurse Practitioner

## 2023-10-30 DIAGNOSIS — M5126 Other intervertebral disc displacement, lumbar region: Secondary | ICD-10-CM | POA: Diagnosis not present

## 2023-10-30 DIAGNOSIS — M5432 Sciatica, left side: Secondary | ICD-10-CM | POA: Diagnosis not present

## 2023-10-30 DIAGNOSIS — M47816 Spondylosis without myelopathy or radiculopathy, lumbar region: Secondary | ICD-10-CM | POA: Diagnosis not present

## 2023-10-30 DIAGNOSIS — M5442 Lumbago with sciatica, left side: Secondary | ICD-10-CM | POA: Insufficient documentation

## 2023-10-30 DIAGNOSIS — M5441 Lumbago with sciatica, right side: Secondary | ICD-10-CM | POA: Insufficient documentation

## 2023-10-30 DIAGNOSIS — M4186 Other forms of scoliosis, lumbar region: Secondary | ICD-10-CM | POA: Diagnosis not present

## 2023-10-30 DIAGNOSIS — M5431 Sciatica, right side: Secondary | ICD-10-CM | POA: Insufficient documentation

## 2023-10-30 DIAGNOSIS — G8929 Other chronic pain: Secondary | ICD-10-CM | POA: Insufficient documentation

## 2023-10-30 DIAGNOSIS — M48061 Spinal stenosis, lumbar region without neurogenic claudication: Secondary | ICD-10-CM | POA: Diagnosis not present

## 2023-11-18 ENCOUNTER — Ambulatory Visit (HOSPITAL_BASED_OUTPATIENT_CLINIC_OR_DEPARTMENT_OTHER)

## 2023-11-18 ENCOUNTER — Encounter (HOSPITAL_COMMUNITY): Payer: Self-pay | Admitting: Gastroenterology

## 2023-11-18 ENCOUNTER — Encounter (HOSPITAL_COMMUNITY)
Admission: RE | Disposition: A | Discharge status: Home / Self Care | Payer: Self-pay | Source: Home / Self Care | Attending: Gastroenterology

## 2023-11-18 ENCOUNTER — Telehealth: Payer: Self-pay | Admitting: *Deleted

## 2023-11-18 ENCOUNTER — Ambulatory Visit (HOSPITAL_COMMUNITY)

## 2023-11-18 ENCOUNTER — Ambulatory Visit (HOSPITAL_COMMUNITY)
Admission: RE | Admit: 2023-11-18 | Discharge: 2023-11-18 | Disposition: A | Discharge status: Home / Self Care | Attending: Gastroenterology | Admitting: Gastroenterology

## 2023-11-18 ENCOUNTER — Other Ambulatory Visit: Payer: Self-pay

## 2023-11-18 DIAGNOSIS — D122 Benign neoplasm of ascending colon: Secondary | ICD-10-CM

## 2023-11-18 DIAGNOSIS — F17210 Nicotine dependence, cigarettes, uncomplicated: Secondary | ICD-10-CM

## 2023-11-18 DIAGNOSIS — Z1211 Encounter for screening for malignant neoplasm of colon: Secondary | ICD-10-CM | POA: Diagnosis not present

## 2023-11-18 DIAGNOSIS — Z7984 Long term (current) use of oral hypoglycemic drugs: Secondary | ICD-10-CM | POA: Insufficient documentation

## 2023-11-18 DIAGNOSIS — R1319 Other dysphagia: Secondary | ICD-10-CM

## 2023-11-18 DIAGNOSIS — K2270 Barrett's esophagus without dysplasia: Secondary | ICD-10-CM

## 2023-11-18 DIAGNOSIS — I10 Essential (primary) hypertension: Secondary | ICD-10-CM | POA: Insufficient documentation

## 2023-11-18 DIAGNOSIS — D125 Benign neoplasm of sigmoid colon: Secondary | ICD-10-CM

## 2023-11-18 DIAGNOSIS — K648 Other hemorrhoids: Secondary | ICD-10-CM | POA: Diagnosis not present

## 2023-11-18 DIAGNOSIS — K5730 Diverticulosis of large intestine without perforation or abscess without bleeding: Secondary | ICD-10-CM | POA: Diagnosis not present

## 2023-11-18 DIAGNOSIS — E119 Type 2 diabetes mellitus without complications: Secondary | ICD-10-CM | POA: Insufficient documentation

## 2023-11-18 HISTORY — PX: COLONOSCOPY: SHX5424

## 2023-11-18 HISTORY — DX: Scoliosis, unspecified: M41.9

## 2023-11-18 HISTORY — DX: Type 2 diabetes mellitus without complications: E11.9

## 2023-11-18 LAB — GLUCOSE, CAPILLARY: Glucose-Capillary: 114 mg/dL — ABNORMAL HIGH (ref 70–99)

## 2023-11-18 SURGERY — COLONOSCOPY
Anesthesia: General

## 2023-11-18 MED ORDER — PROPOFOL 500 MG/50ML IV EMUL
INTRAVENOUS | Status: DC | PRN
  Administered 2023-11-18: 100 mg via INTRAVENOUS
  Administered 2023-11-18: 150 ug/kg/min via INTRAVENOUS

## 2023-11-18 MED ORDER — LACTATED RINGERS IV SOLN: INTRAVENOUS | Status: DC | PRN

## 2023-11-18 MED ORDER — LACTATED RINGERS IV SOLN: INTRAVENOUS | Status: DC

## 2023-11-18 NOTE — Transfer of Care (Signed)
 Immediate Anesthesia Transfer of Care Note  Patient: Kara Garcia  Procedure(s) Performed: COLONOSCOPY  Patient Location: Endoscopy Unit  Anesthesia Type:General  Level of Consciousness: awake and alert   Airway & Oxygen  Therapy: Patient Spontanous Breathing  Post-op Assessment: Report given to RN and Post -op Vital signs reviewed and stable  Post vital signs: Reviewed and stable  Last Vitals:  Vitals Value Taken Time  BP 108/68 11/18/23 10:55  Temp 36.6 C 11/18/23 10:55  Pulse 89 11/18/23 10:55  Resp 16 11/18/23 10:55  SpO2 99 % 11/18/23 10:55    Last Pain:  Vitals:   11/18/23 1055  TempSrc: Oral  PainSc: 8       Patients Stated Pain Goal: 9 (11/18/23 0926)  Complications: No notable events documented.

## 2023-11-18 NOTE — Telephone Encounter (Signed)
 Called pt and she is aware Dr. Cindie out sick today and Dr. Cinderella will be performing her procedure. She was fine with this.

## 2023-11-18 NOTE — Op Note (Signed)
 [facility] Patient Name: Kara Garcia Procedure Date: 11/18/2023 10:20 AM MRN: [id number] Date of Birth: 1955/02/28 Attending MD: Deatrice Dine , MD, [id number] CSN: [id number] Age: 68 Admit Type: Outpatient Procedure:                Colonoscopy Indications:              Screening for colorectal malignant neoplasm Providers:                Deatrice Dine, MD, Madelin Hunter, RN, [place] Wilson,                            Technician Referring MD:             Carlin POUR. Cindie, DO Medicines:                Monitored Anesthesia Care Complications:            No immediate complications. Estimated Blood Loss:     Estimated blood loss: none. Procedure:                Pre-Anesthesia Assessment:                           - Prior to the procedure, a History and Physical                            was performed, and patient medications and                            allergies were reviewed. The patient's tolerance of                            previous anesthesia was also reviewed. The risks                            and benefits of the procedure and the sedation                            options and risks were discussed with the patient.                            All questions were answered, and informed consent                            was obtained. Prior Anticoagulants: The patient has                            taken no anticoagulant or antiplatelet agents. ASA                            Grade Assessment: II - A patient with mild systemic                            disease. After reviewing the risks and benefits,  the patient was deemed in satisfactory condition to                            undergo the procedure.                           After obtaining informed consent, the colonoscope                            was passed under direct vision. Throughout the                            procedure, the patient's blood pressure, pulse, and                             oxygen  saturations were monitored continuously. The                            [id number]-[id number] ([id number]) Colon was introduced through                            the anus and advanced to the the cecum, identified                            by appendiceal orifice and ileocecal valve. The                            colonoscopy was performed without difficulty. The                            patient tolerated the procedure well. The quality                            of the bowel preparation was evaluated using the                            BBPS [facility] Bowel Preparation Scale) with scores                            of: Right Colon = 3, Transverse Colon = 3 and Left                            Colon = 3 (entire mucosa seen well with no residual                            staining, small fragments of stool or opaque                            liquid). The total BBPS score equals 9. The                            ileocecal valve, appendiceal orifice, and rectum  were photographed. Scope In: 10:33:43 AM Scope Out: 10:52:13 AM Scope Withdrawal Time: 0 hours 15 minutes 50 seconds  Total Procedure Duration: 0 hours 18 minutes 30 seconds  Findings:      The perianal and digital rectal examinations were normal.      A few medium-mouthed diverticula were found in the left colon.      Non-bleeding internal hemorrhoids were found during retroflexion. The       hemorrhoids were small.      The exam was otherwise normal throughout the examined colon. Impression:               - Diverticulosis in the left colon.                           - Non-bleeding internal hemorrhoids.                           - No specimens collected. Moderate Sedation:      Per Anesthesia Care Recommendation:           - Patient has a contact number available for                            emergencies. The signs and symptoms of potential                            delayed complications were discussed  with the                            patient. Return to normal activities tomorrow.                            Written discharge instructions were provided to the                            patient.                           - Resume previous diet.                           - Continue present medications.                           - Await pathology results.                           - Repeat colonoscopy in 10 years for screening                            purposes.                           - Return to primary care physician as previously                            scheduled. Procedure Code(s):        --- Professional ---  H9878, Colorectal cancer screening; colonoscopy on                            individual not meeting criteria for high risk Diagnosis Code(s):        --- Professional ---                           Z12.11, Encounter for screening for malignant                            neoplasm of colon                           K64.8, Other hemorrhoids                           K57.30, Diverticulosis of large intestine without                            perforation or abscess without bleeding CPT copyright 2022 American Medical Association. All rights reserved. The codes documented in this report are preliminary and upon coder review may  be revised to meet current compliance requirements. Deatrice Dine, MD Deatrice Dine, MD 11/18/2023 10:58:32 AM This report has been signed electronically. Number of Addenda: 0

## 2023-11-18 NOTE — Discharge Instructions (Signed)

## 2023-11-18 NOTE — Anesthesia Postprocedure Evaluation (Signed)
 Anesthesia Post Note  Patient: Kara Garcia  Procedure(s) Performed: COLONOSCOPY  Patient location during evaluation: PACU Anesthesia Type: General Level of consciousness: awake and alert Pain management: pain level controlled Vital Signs Assessment: post-procedure vital signs reviewed and stable Respiratory status: spontaneous breathing, nonlabored ventilation, respiratory function stable and patient connected to nasal cannula oxygen  Cardiovascular status: blood pressure returned to baseline and stable Postop Assessment: no apparent nausea or vomiting Anesthetic complications: no   No notable events documented.   Last Vitals:  Vitals:   11/18/23 1055 11/18/23 1057  BP: 108/68 113/85  Pulse: 89   Resp: 16   Temp: 36.6 C   SpO2: 99%     Last Pain:  Vitals:   11/18/23 1055  TempSrc: Oral  PainSc: 8                  Andrea Limes

## 2023-11-18 NOTE — Anesthesia Preprocedure Evaluation (Signed)
 Anesthesia Evaluation  Patient identified by MRN, date of birth, ID band Patient awake    Reviewed: Allergy & Precautions, H&P , NPO status , Patient's Chart, lab work & pertinent test results  Airway Mallampati: II  TM Distance: >3 FB Neck ROM: Full    Dental no notable dental hx.    Pulmonary neg pulmonary ROS, Current Smoker   Pulmonary exam normal breath sounds clear to auscultation       Cardiovascular hypertension, Normal cardiovascular exam Rhythm:Regular Rate:Normal     Neuro/Psych  Neuromuscular disease  negative psych ROS   GI/Hepatic negative GI ROS, Neg liver ROS,,,  Endo/Other  diabetes, Well Controlled, Oral Hypoglycemic Agents    Renal/GU negative Renal ROS  negative genitourinary   Musculoskeletal negative musculoskeletal ROS (+)    Abdominal   Peds negative pediatric ROS (+)  Hematology negative hematology ROS (+)   Anesthesia Other Findings   Reproductive/Obstetrics negative OB ROS                              Anesthesia Physical Anesthesia Plan  ASA: 2  Anesthesia Plan: General   Post-op Pain Management:    Induction: Intravenous  PONV Risk Score and Plan:   Airway Management Planned: Nasal Cannula  Additional Equipment:   Intra-op Plan:   Post-operative Plan:   Informed Consent: I have reviewed the patients History and Physical, chart, labs and discussed the procedure including the risks, benefits and alternatives for the proposed anesthesia with the patient or authorized representative who has indicated his/her understanding and acceptance.     Dental advisory given  Plan Discussed with: CRNA  Anesthesia Plan Comments:         Anesthesia Quick Evaluation

## 2023-11-18 NOTE — H&P (Signed)
 Primary Care Physician:  Joeann Browning, FNP Primary Gastroenterologist:  Dr. Cinderella  Pre-Procedure History & Physical: HPI:  Kara Garcia is a 68 y.o. female is here for a colonoscopy for colon cancer screening purposes.  Patient denies any family history of colorectal cancer.  No melena or hematochezia.  No abdominal pain or unintentional weight loss.  No change in bowel habits.  Overall feels well from a GI standpoint.  Past Medical History:  Diagnosis Date   Borderline diabetes    Carpal tunnel syndrome 03/22/2015   Bilateral   Diabetes mellitus without complication (HCC)    Hypertension    Left arm numbness    Scoliosis     Past Surgical History:  Procedure Laterality Date   ABDOMINAL ADHESION SURGERY     BACK SURGERY     CHOLECYSTECTOMY     COLONOSCOPY N/A 11/17/2012   Procedure: COLONOSCOPY;  Surgeon: Oneil DELENA Budge, MD;  Location: [facility];  Service: Gastroenterology;  Laterality: N/A;   Left knee arthroscopy      Prior to Admission medications   Medication Sig Start Date End Date Taking? Authorizing Provider  acetaminophen (TYLENOL) 650 MG CR tablet Take 650 mg by mouth every 8 (eight) hours as needed for pain.   Yes [provider]  amLODipine (NORVASC) 2.5 MG tablet Take 2.5 mg by mouth daily. 09/08/23  Yes [provider]  chlorthalidone (HYGROTON) 25 MG tablet Take 25 mg by mouth daily.   Yes [provider]  glipiZIDE (GLUCOTROL) 5 MG tablet Take by mouth daily before breakfast.   Yes [provider]  losartan (COZAAR) 100 MG tablet Take 100 mg by mouth daily.     Yes [provider]  omeprazole (PRILOSEC) 40 MG capsule Take 40 mg by mouth daily.   Yes [provider]  rosuvastatin (CRESTOR) 5 MG tablet Take 5 mg by mouth daily. 09/08/23  Yes [provider]  traMADol (ULTRAM) 50 MG tablet Take by mouth every 6 (six) hours as needed.   Yes [provider]  RYBELSUS 3 MG TABS Take 1 tablet  by mouth daily. 07/08/23   [provider]    Allergies as of 10/23/2023 - Review Complete 09/23/2023  Allergen Reaction Noted   Metformin and related Other (See Comments) 11/12/2021    Family History  Problem Relation Age of Onset   Cancer Father    Heart attack Mother    Cancer Brother        prostate   COPD Brother    Cancer Sister    Cancer Sister    Other Sister        blocked arteries   Cancer Sister    Other Daughter        bone deteriation   Lupus Daughter    Colon cancer Neg Hx     Social History   Socioeconomic History   Marital status: Married    Spouse name: Not on file   Number of children: Not on file   Years of education: Not on file   Highest education level: Not on file  Occupational History   Not on file  Tobacco Use   Smoking status: Every Day    Current packs/day: 0.50    Average packs/day: 0.5 packs/day for 41.0 years (20.5 ttl pk-yrs)    Types: Cigarettes   Smokeless tobacco: Never  Vaping Use   Vaping status: Never Used  Substance and Sexual Activity   Alcohol use: No   Drug  use: No   Sexual activity: Yes    Birth control/protection: Post-menopausal  Other Topics Concern   Not on file  Social History Narrative   Not on file   Social Drivers of Health   Financial Resource Strain: Low Risk  (11/12/2021)   Overall Financial Resource Strain (CARDIA)    Difficulty of Paying Living Expenses: Not hard at all  Food Insecurity: No Food Insecurity (11/12/2021)   Hunger Vital Sign    Worried About Running Out of Food in the Last Year: Never true    Ran Out of Food in the Last Year: Never true  Transportation Needs: No Transportation Needs (11/12/2021)   PRAPARE - Administrator, Civil Service (Medical): No    Lack of Transportation (Non-Medical): No  Physical Activity: Insufficiently Active (11/12/2021)   Exercise Vital Sign    Days of Exercise per Week: 2 days    Minutes of Exercise per Session: 20 min  Stress: No  Stress Concern Present (11/12/2021)   [employer] of Occupational Health - Occupational Stress Questionnaire    Feeling of Stress : Not at all  Social Connections: Moderately Integrated (11/12/2021)   Social Connection and Isolation Panel    Frequency of Communication with Friends and Family: More than three times a week    Frequency of Social Gatherings with Friends and Family: More than three times a week    Attends Religious Services: More than 4 times per year    Active Member of Clubs or Organizations: No    Attends Banker Meetings: Never    Marital Status: Married  Catering manager Violence: Not At Risk (11/12/2021)   Humiliation, Afraid, Rape, and Kick questionnaire    Fear of Current or Ex-Partner: No    Emotionally Abused: No    Physically Abused: No    Sexually Abused: No    Review of Systems: See HPI, otherwise negative ROS  Physical Exam: Vital signs in last 24 hours: Temp:  [98.5 F (36.9 C)] 98.5 F (36.9 C) (10/07 0926) Pulse Rate:  [93] 93 (10/07 0926) Resp:  [18] 18 (10/07 0926) BP: (156)/(72) 156/72 (10/07 0926) SpO2:  [98 %] 98 % (10/07 0926) Weight:  [71.2 kg] 71.2 kg (10/07 0926)   General:   Alert,  Well-developed, well-nourished, pleasant and cooperative in NAD Head:  Normocephalic and atraumatic. Eyes:  Sclera clear, no icterus.   Conjunctiva pink. Ears:  Normal auditory acuity. Nose:  No deformity, discharge,  or lesions. Msk:  Symmetrical without gross deformities. Normal posture. Extremities:  Without clubbing or edema. Neurologic:  Alert and  oriented x4;  grossly normal neurologically. Skin:  Intact without significant lesions or rashes. Psych:  Alert and cooperative. Normal mood and affect.  Impression/Plan: Kara Garcia is here for a colonoscopy to be performed for colon cancer screening purposes.  The risks of the procedure including infection, bleed, or perforation as well as benefits, limitations, alternatives and  imponderables have been reviewed with the patient. Questions have been answered. All parties agreeable.

## 2023-11-21 ENCOUNTER — Encounter (HOSPITAL_COMMUNITY): Payer: Self-pay | Admitting: Gastroenterology

## 2023-12-11 DIAGNOSIS — I10 Essential (primary) hypertension: Secondary | ICD-10-CM | POA: Diagnosis not present

## 2023-12-18 DIAGNOSIS — M5441 Lumbago with sciatica, right side: Secondary | ICD-10-CM | POA: Diagnosis not present

## 2023-12-18 DIAGNOSIS — E1165 Type 2 diabetes mellitus with hyperglycemia: Secondary | ICD-10-CM | POA: Diagnosis not present

## 2023-12-18 DIAGNOSIS — M5442 Lumbago with sciatica, left side: Secondary | ICD-10-CM | POA: Diagnosis not present

## 2023-12-18 DIAGNOSIS — I10 Essential (primary) hypertension: Secondary | ICD-10-CM | POA: Diagnosis not present

## 2023-12-18 DIAGNOSIS — G8929 Other chronic pain: Secondary | ICD-10-CM | POA: Diagnosis not present

## 2023-12-18 DIAGNOSIS — F17200 Nicotine dependence, unspecified, uncomplicated: Secondary | ICD-10-CM | POA: Diagnosis not present

## 2023-12-18 DIAGNOSIS — E782 Mixed hyperlipidemia: Secondary | ICD-10-CM | POA: Diagnosis not present
